# Patient Record
Sex: Female | Born: 1935 | Marital: Single | Smoking: Never smoker
Health system: Southern US, Community
[De-identification: ages and names within clinical notes are randomized; demographics above are authoritative.]

## PROBLEM LIST (undated history)

## (undated) DIAGNOSIS — I10 Essential (primary) hypertension: Secondary | ICD-10-CM

## (undated) HISTORY — DX: Essential (primary) hypertension: I10

---

## 1998-06-23 DIAGNOSIS — I10 Essential (primary) hypertension: Secondary | ICD-10-CM

## 1998-06-23 HISTORY — DX: Essential (primary) hypertension: I10

## 2020-03-02 ENCOUNTER — Emergency Department (HOSPITAL_BASED_OUTPATIENT_CLINIC_OR_DEPARTMENT_OTHER): Payer: Self-pay

## 2020-03-02 ENCOUNTER — Encounter (HOSPITAL_BASED_OUTPATIENT_CLINIC_OR_DEPARTMENT_OTHER): Payer: Self-pay | Admitting: Emergency Medicine

## 2020-03-02 ENCOUNTER — Other Ambulatory Visit: Payer: Self-pay

## 2020-03-02 DIAGNOSIS — I10 Essential (primary) hypertension: Secondary | ICD-10-CM | POA: Insufficient documentation

## 2020-03-02 DIAGNOSIS — C4921 Malignant neoplasm of connective and soft tissue of right lower limb, including hip: Secondary | ICD-10-CM | POA: Insufficient documentation

## 2020-03-02 NOTE — ED Triage Notes (Signed)
Ptc/o right lower leg pain. No injury. Swelling noted to lateral aspect below knee

## 2020-03-03 ENCOUNTER — Emergency Department (HOSPITAL_BASED_OUTPATIENT_CLINIC_OR_DEPARTMENT_OTHER)
Admission: EM | Admit: 2020-03-03 | Discharge: 2020-03-03 | Disposition: A | Discharge status: Home / Self Care | Payer: Self-pay | Attending: Emergency Medicine | Admitting: Emergency Medicine

## 2020-03-03 ENCOUNTER — Emergency Department (HOSPITAL_BASED_OUTPATIENT_CLINIC_OR_DEPARTMENT_OTHER): Payer: Self-pay

## 2020-03-03 ENCOUNTER — Encounter (HOSPITAL_BASED_OUTPATIENT_CLINIC_OR_DEPARTMENT_OTHER): Payer: Self-pay | Admitting: Emergency Medicine

## 2020-03-03 DIAGNOSIS — C4921 Malignant neoplasm of connective and soft tissue of right lower limb, including hip: Secondary | ICD-10-CM

## 2020-03-03 LAB — CBC WITH DIFFERENTIAL/PLATELET
Abs Immature Granulocytes: 0.02 10*3/uL (ref 0.00–0.07)
Basophils Absolute: 0.1 10*3/uL (ref 0.0–0.1)
Basophils Relative: 1 %
Eosinophils Absolute: 0.1 10*3/uL (ref 0.0–0.5)
Eosinophils Relative: 2 %
HCT: 42.8 % (ref 36.0–46.0)
Hemoglobin: 13.8 g/dL (ref 12.0–15.0)
Immature Granulocytes: 0 %
Lymphocytes Relative: 40 %
Lymphs Abs: 2.8 10*3/uL (ref 0.7–4.0)
MCH: 28.5 pg (ref 26.0–34.0)
MCHC: 32.2 g/dL (ref 30.0–36.0)
MCV: 88.2 fL (ref 80.0–100.0)
Monocytes Absolute: 0.7 10*3/uL (ref 0.1–1.0)
Monocytes Relative: 10 %
Neutro Abs: 3.3 10*3/uL (ref 1.7–7.7)
Neutrophils Relative %: 47 %
Platelets: 350 10*3/uL (ref 150–400)
RBC: 4.85 MIL/uL (ref 3.87–5.11)
RDW: 14.3 % (ref 11.5–15.5)
WBC: 7 10*3/uL (ref 4.0–10.5)
nRBC: 0 % (ref 0.0–0.2)

## 2020-03-03 LAB — BASIC METABOLIC PANEL
Anion gap: 10 (ref 5–15)
BUN: 14 mg/dL (ref 8–23)
CO2: 26 mmol/L (ref 22–32)
Calcium: 9.4 mg/dL (ref 8.9–10.3)
Chloride: 101 mmol/L (ref 98–111)
Creatinine, Ser: 0.68 mg/dL (ref 0.44–1.00)
GFR calc Af Amer: >60 mL/min (ref >60)
GFR calc non Af Amer: >60 mL/min (ref >60)
Glucose, Bld: 92 mg/dL (ref 70–99)
Potassium: 4.2 mmol/L (ref 3.5–5.1)
Sodium: 137 mmol/L (ref 135–145)

## 2020-03-03 MED ORDER — IOHEXOL 300 MG/ML  SOLN
100.0000 mL | Freq: Once | INTRAMUSCULAR | Status: AC | PRN
  Administered 2020-03-03: 100 mL via INTRAVENOUS

## 2020-03-03 MED ORDER — AMLODIPINE BESYLATE 5 MG PO TABS
5.0000 mg | ORAL_TABLET | Freq: Every day | ORAL | 1 refills | Status: DC
Start: 2020-03-03 — End: 2020-05-01

## 2020-03-03 NOTE — ED Notes (Signed)
ED Provider at bedside. 

## 2020-03-03 NOTE — ED Notes (Signed)
Pt presents with mass to right leg distal to knee. Is tender to touch and movent. Per son doctor in [place] attemped diagnosis but was unclear on outcome and stated they did not complete diagnosis.  Pt also request refill on BP meds.

## 2020-03-03 NOTE — ED Notes (Signed)
Attempted IV to right hand, blood obtained for labs, unsuccessful IV, tol well

## 2020-03-03 NOTE — ED Provider Notes (Signed)
[facility] Provider Note: Carolyn Spurling, MD, FACEP  CSN: [id number] MRN: [id number] ARRIVAL: 03/02/20 at 2036 ROOM: [place]  Leg Pain   HISTORY OF PRESENT ILLNESS  03/03/20 3:09 AM Carolyn Evans is a 84 y.o. female who is from [place].  She has had a mass of her right lower leg for the past 7 years.  It is located on the anterolateral aspect of the leg.  It began hurting more than usual yesterday to the point that she began crying.  On arrival she rated the pain as an 8 out of 10.  It is worse with movement or palpation.   Past Medical History:  Diagnosis Date  . Hypertension     History reviewed. No pertinent surgical history.  No family history on file.  Social History   Tobacco Use  . Smoking status: Never Smoker  . Smokeless tobacco: Never Used  Substance Use Topics  . Alcohol use: Never  . Drug use: Not on file    Prior to Admission medications   Not on File    Allergies Aspirin   REVIEW OF SYSTEMS  Negative except as noted here or in the History of Present Illness.   PHYSICAL EXAMINATION  Initial Vital Signs Blood pressure (!) 179/84, pulse 80, temperature 98.4 F (36.9 C), temperature source Oral, resp. rate 20, SpO2 100 %.  Examination General: Well-developed, cachectic female in no acute distress; appearance consistent with age of record HENT: normocephalic; atraumatic Eyes: pupils equal, round and reactive to light; extraocular muscles intact; arcus senilis bilaterally Neck: supple Heart: regular rate and rhythm Lungs: clear to auscultation bilaterally Abdomen: soft; nondistended; nontender; bowel sounds present Extremities: Pulses normal; arthritic changes; tender soft tissue mass of right lower leg without erythema or warmth:    Neurologic: Awake, alert; motor function intact in all extremities and symmetric; no facial droop Skin: Warm and dry Psychiatric: Normal mood and affect   RESULTS  Summary of this  visit's results, reviewed and interpreted by myself:   EKG Interpretation  Date/Time:    Ventricular Rate:    PR Interval:    QRS Duration:   QT Interval:    QTC Calculation:   R Axis:     Text Interpretation:        Laboratory Studies: Results for orders placed or performed during the hospital encounter of 03/03/20 (from the past 24 hour(s))  CBC with Differential/Platelet     Status: None   Collection Time: 03/03/20  3:21 AM  Result Value Ref Range   WBC 7.0 4.0 - 10.5 K/uL   RBC 4.85 3.87 - 5.11 MIL/uL   Hemoglobin 13.8 12.0 - 15.0 g/dL   HCT 42.8 36 - 46 %   MCV 88.2 80.0 - 100.0 fL   MCH 28.5 26.0 - 34.0 pg   MCHC 32.2 30.0 - 36.0 g/dL   RDW 14.3 11.5 - 15.5 %   Platelets 350 150 - 400 K/uL   nRBC 0.0 0.0 - 0.2 %   Neutrophils Relative % 47 %   Neutro Abs 3.3 1.7 - 7.7 K/uL   Lymphocytes Relative 40 %   Lymphs Abs 2.8 0.7 - 4.0 K/uL   Monocytes Relative 10 %   Monocytes Absolute 0.7 0 - 1 K/uL   Eosinophils Relative 2 %   Eosinophils Absolute 0.1 0 - 0 K/uL   Basophils Relative 1 %   Basophils Absolute 0.1 0 - 0 K/uL   Immature Granulocytes 0 %  Abs Immature Granulocytes 0.02 0.00 - 0.07 K/uL  Basic metabolic panel     Status: None   Collection Time: 03/03/20  3:21 AM  Result Value Ref Range   Sodium 137 135 - 145 mmol/L   Potassium 4.2 3.5 - 5.1 mmol/L   Chloride 101 98 - 111 mmol/L   CO2 26 22 - 32 mmol/L   Glucose, Bld 92 70 - 99 mg/dL   BUN 14 8 - 23 mg/dL   Creatinine, Ser 0.68 0.44 - 1.00 mg/dL   Calcium 9.4 8.9 - 10.3 mg/dL   GFR calc non Af Amer >60 >60 mL/min   GFR calc Af Amer >60 >60 mL/min   Anion gap 10 5 - 15   Imaging Studies: DG Tibia/Fibula Right  Result Date: 03/02/2020 CLINICAL DATA:  Atraumatic right leg pain. EXAM: RIGHT TIBIA AND FIBULA - 2 VIEW COMPARISON:  None. FINDINGS: There is no evidence of fracture or other focal bone lesions. A 10.8 cm x 1.4 cm area focal soft tissue swelling seen adjacent to the anterior aspect of the  proximal right tibia. IMPRESSION: Focal area of soft tissue swelling adjacent to the anterior aspect of the proximal right tibia. Correlation with MRI is recommended, as an underlying soft tissue mass cannot be excluded. Electronically Signed   By: Virgina Norfolk M.D.   On: 03/02/2020 22:40   CT EXTREMITY LOWER RIGHT W CONTRAST  Result Date: 03/03/2020 CLINICAL DATA:  Lower extremity mass EXAM: CT OF THE LOWER RIGHT EXTREMITY WITH CONTRAST TECHNIQUE: Multidetector CT imaging of the lower right extremity was performed according to the standard protocol following intravenous contrast administration. COMPARISON:  None. CONTRAST:  139mL OMNIPAQUE IOHEXOL 300 MG/ML  SOLN FINDINGS: Bones/Joint/Cartilage No aggressive osseous lesion. Ligaments Suboptimally assessed by CT. Muscles and Tendons Unremarkable Soft tissues There is a bilobed fat density mass within the proximal lower extremity adjacent to the tibial metadiaphysis. The lateral component measures 4.8 x 5.6 by 12.8 cm. The more medial component measures 2.6 by 2.9 by 7.4 cm. The bilobed mass extends between the proximal tibia and fibula. Again the mass is predominantly fat density; however, there are vessels within the mass and subtle wispy densities indicating internal complexity. IMPRESSION: Bilobed fat density mass within the proximal lower extremity adjacent to the tibial metadiaphysis. While the mass is predominantly fat density, there is internal complexity with differential including complex lipoma versus well differentiated liposarcoma. Recommend non emergent orthopedic oncology surgery consultation and further contrast MRI evaluation. No acute findings evident. Electronically Signed   By: Suzy Bouchard M.D.   On: 03/03/2020 04:49    ED COURSE and MDM  Nursing notes, initial and subsequent vitals signs, including pulse oximetry, reviewed and interpreted by myself.  Vitals:   03/02/20 2048 03/03/20 0148 03/03/20 0150 03/03/20 0330  BP: (!)  164/83  (!) 179/84   Pulse: 91  80 68  Resp:   20 20  Temp: 98 F (36.7 C)  98.4 F (36.9 C)   TempSrc: Oral  Oral   SpO2: 100% 98% 100% 100%   Medications  iohexol (OMNIPAQUE) 300 MG/ML solution 100 mL (100 mLs Intravenous Contrast Given 03/03/20 0422)   5:18 AM Advised patient and her son of the CT findings consistent with a lipoma but cannot exclude a liposarcoma.  We will thus refer to orthopedics for further evaluation.   PROCEDURES  Procedures   ED DIAGNOSES     ICD-10-CM   1. 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

## 2020-03-03 NOTE — ED Notes (Signed)
Patient transported to CT 

## 2020-04-12 ENCOUNTER — Encounter: Payer: Self-pay | Admitting: Family Medicine

## 2020-04-12 ENCOUNTER — Other Ambulatory Visit: Payer: Self-pay

## 2020-04-12 ENCOUNTER — Ambulatory Visit (INDEPENDENT_AMBULATORY_CARE_PROVIDER_SITE_OTHER): Payer: Self-pay | Admitting: Family Medicine

## 2020-04-12 VITALS — BP 150/80 | HR 74 | Wt 101.2 lb

## 2020-04-12 DIAGNOSIS — R2241 Localized swelling, mass and lump, right lower limb: Secondary | ICD-10-CM

## 2020-04-12 DIAGNOSIS — Z5989 Other problems related to housing and economic circumstances: Secondary | ICD-10-CM

## 2020-04-12 DIAGNOSIS — I10 Essential (primary) hypertension: Secondary | ICD-10-CM

## 2020-04-12 MED ORDER — MIRTAZAPINE 7.5 MG PO TABS: 7.5000 mg | ORAL_TABLET | Freq: Every day | ORAL | 1 refills | Status: AC

## 2020-04-12 NOTE — Patient Instructions (Signed)
It was a pleasure to see you today!  Thank you for choosing [facility] for your primary care.  Tyleah Haan was seen for leg swelling .   Our plans for today were:  Please let us know when you have been able to complete the [employer]  paper work.   I will work with our referral specialist to coordinate an affordable option for a surgeon to evaluate the leg mass.    You should return to our clinic in 3-4 weeks for follow up.   Best Wishes,   Dr. Alba Cory

## 2020-04-12 NOTE — Progress Notes (Signed)
    SUBJECTIVE:   CHIEF COMPLAINT / HPI: follow up for leg mass   [place] Interpreter Used during this encounter  Patient seen in the ED on 9/11 for painful leg mass. Imaging at that time was concerning for liposarcoma or lipoma. Mass has been present for 10 years and was evaluated at in [place] but did not have equipment to perform necessary biopsy/surgery. Patient reports that the mass sometimes interferes with her ability to ambulate. It is sometimes painful but usually not as bad as the time she was seen in the ED. She was referred to orthopedics but due to no insurance coverage, they did not send her for MRI due to concern for out of pocket cost. Patient's nephew is present during interview and states that they are currently working to apply for [employer] .    HTN  Has not been checking blood pressure at home. Has been taking amlodipine for 5-6 years. Denies any leg swelling associated with medications. Denies regular HA or chest pain/SOB.    PERTINENT  PMH / PSH: HTN, RLE mass   OBJECTIVE:   BP (!) 150/80   Pulse 74   Wt 101 lb 3.2 oz (45.9 kg)   SpO2 99%    General: female appearing stated age in no acute distress, frail appearing with thin extremities  Cardio: Normal S1 and S2, no S3 or S4. Rhythm is regular. Bilateral radial pulses palpable Pulm: Clear to auscultation bilaterally, no crackles, wheezing, or diminished breath sounds. Normal respiratory effort Abdomen: Bowel sounds normal. Abdomen soft and non-tender.  Extremities: No peripheral edema. RLE with mass as pictured below   Neuro: pt alert and oriented x4  Media Information   Document Information   Media Information   Document Information     ASSESSMENT/PLAN:   Leg mass, right Leg mass intermittently painful. Recent CT imaging with differential of liposarcoma vs lipoma. Ultimately, patient needs MRI and likely biopsy vs excision but recommended to have orthopedic or orthopedic oncology  referral. Further evaluation limited due to financial concerns - referral to chronic care management to assist with resources/insurance enrollment   HTN, goal below 150/90 BP borderline for elevation given age today. Patient will continue amlodipine.  - refill sent for medication     Eulis Foster, MD [place]

## 2020-04-16 DIAGNOSIS — I10 Essential (primary) hypertension: Secondary | ICD-10-CM | POA: Insufficient documentation

## 2020-04-16 DIAGNOSIS — R2241 Localized swelling, mass and lump, right lower limb: Secondary | ICD-10-CM | POA: Insufficient documentation

## 2020-04-16 NOTE — Assessment & Plan Note (Signed)
Leg mass intermittently painful. Recent CT imaging with differential of liposarcoma vs lipoma. Ultimately, patient needs MRI and likely biopsy vs excision but recommended to have orthopedic or orthopedic oncology referral. Further evaluation limited due to financial concerns - referral to chronic care management to assist with resources/insurance enrollment

## 2020-04-16 NOTE — Assessment & Plan Note (Signed)
BP borderline for elevation given age today. Patient will continue amlodipine.  - refill sent for medication

## 2020-04-20 ENCOUNTER — Telehealth: Payer: Self-pay | Admitting: Family Medicine

## 2020-04-20 NOTE — Telephone Encounter (Signed)
   SF 04/20/2020   Name: Carolyn Evans   MRN: [id number]   DOB: [birth year]   AGE: 84 y.o.   GENDER: female   PCP Simmons-Robinson, Riki Sheer, MD.    [facility]. Connected with [place] (ID [id number]). Contacted patient's son Marcy Siren he stated he speaks a  [place]. [place] Interpreter stayed on call to provide assistance as needed. Spoke with Marcy Siren  regarding the referral for his mother [name]. Patient needed assistance with getting insurance. Marcy Siren stated that his mother does not qualify for Medicaid or Medicare because she does not have any income. He stated that he has applied for the [employer] and Ms. Angalena Schildt was approved. Marcy Siren  has applied for [place] assistance for his mom  and is waiting to hear back from them. Asked Marcy Siren if his mom has any additional needs at this time. Abul stated she does not. Informed Marcy Siren that if he does not hear back from [facility] that he can give them a call to check the status of his mom's application. Marcy Siren stated understanding.   Closing referral pending any other needs of patient.     [place], Care Management Phone: [phone] Email: [email or web address]

## 2020-05-01 ENCOUNTER — Ambulatory Visit (HOSPITAL_COMMUNITY)
Admission: RE | Admit: 2020-05-01 | Discharge: 2020-05-01 | Disposition: A | Discharge status: Home / Self Care | Payer: Self-pay | Source: Ambulatory Visit | Attending: Family Medicine | Admitting: Family Medicine

## 2020-05-01 ENCOUNTER — Other Ambulatory Visit: Payer: Self-pay

## 2020-05-01 ENCOUNTER — Ambulatory Visit (INDEPENDENT_AMBULATORY_CARE_PROVIDER_SITE_OTHER): Payer: Self-pay | Admitting: Family Medicine

## 2020-05-01 ENCOUNTER — Encounter: Payer: Self-pay | Admitting: Family Medicine

## 2020-05-01 DIAGNOSIS — R2241 Localized swelling, mass and lump, right lower limb: Secondary | ICD-10-CM

## 2020-05-01 DIAGNOSIS — R002 Palpitations: Secondary | ICD-10-CM

## 2020-05-01 DIAGNOSIS — I10 Essential (primary) hypertension: Secondary | ICD-10-CM

## 2020-05-01 MED ORDER — AMLODIPINE BESYLATE 5 MG PO TABS: 10.0000 mg | ORAL_TABLET | Freq: Every day | ORAL | 2 refills | Status: DC

## 2020-05-01 MED ORDER — AMLODIPINE BESYLATE 5 MG PO TABS
10.0000 mg | ORAL_TABLET | Freq: Every day | ORAL | 2 refills | Status: DC
Start: 2020-05-01 — End: 2020-05-01

## 2020-05-01 NOTE — Patient Instructions (Addendum)
I have sent a referral to orthopedics for further evaluation of your leg mass.  Please let us know if you do not hear from them in order to make an appointment within the next 2 weeks.  Please follow-up with me in 6-8 weeks for your leg mass and AFTER you have been able to see orthopedics.  Please use 400 mg of ibuprofen every 6-8 hours for leg pain.  I also recommend applying a heating pad to your leg to help with any pain.  For your heart rate, I believe you are likely having premature ventricular contractions. These are common and with them occurring infrequently, we will monitor you for now unless they become more bothersome for you. We can recheck an EKG in 6 months.

## 2020-05-01 NOTE — Progress Notes (Signed)
° ° °  SUBJECTIVE:   CHIEF COMPLAINT / HPI: f/u leg mass   Patient's daughter in law is present for this encounter and provides majority of history for this encounter.  Patient reports that her leg pain has been coming and going but painful episodes make it more difficult to ambulate. This pain sometimes radiates to her thigh.  Family has been giving ibuprofen that helps, usually give 400mg   Patient has not been doing heat or ice application to leg mass when experiencing pain. Patient and family deny any decreased sensation to her RLE. Family member rerports that she has some pain that wakes her out of sleep   Patient was recently approved for financial asisstance from 9/21 until 09/10/20. Called to confirm assistance will cover orthopedics referral and MRI if deemed appropriate and was advised that patient will be covered as long as the provider is in the [facility] system.   HTN  Patient has been tolerating amlodipine well without recent HA or chest pain.   Palpitations  Patient's family reports that the patient has been reporting chest discomfort and increased HR with ~3 episodes per week. Patient denies feeling SOB during these episodes.   PERTINENT  PMH / PSH:  Language Barrier  HTN  RLE mass   OBJECTIVE:   BP (!) 162/82    Pulse 97    Wt 104 lb (47.2 kg)    SpO2 97%    General: female appearing stated age in no acute distress Cardio: tachycardia. No murmurs or rubs.  Bilateral radial pulses palpable Pulm: Clear to auscultation bilaterally, no crackles, wheezing, or diminished breath sounds. Normal respiratory effort Extremities: No peripheral edema. Warm/ well perfused. RLE leg mass tender to palpation, no increase is diameter of mass, not warm to touch, popliteal pulse intact on RLE, posterior tibial also palpable on RLE   ASSESSMENT/PLAN:   Leg mass, right Still experiencing intermittent pain that is improved with iburprofen  Normal Cr 0.68 Advised family to use 400mg   every 6 hours for pain  Will refer to orthropedics for further evaluation and clarify services covered with financial asst prior to referral   Intermittent palpitations Palpitations intermittently.  Could be due to patient's low BMI, no murmur appreciated on exam. EKG difficult to interpret but patient appears to be in NSR with possible PVCS  - repeat EKG in 6 months  - patient given return precautions including increasing frequency of palpitations at rest, chest pains that are persistent and SOB   HTN, goal below 150/90 BP elevated for last three hospital/clinic encounters.  Increase amlodipine to 10mg  daily      Eulis Foster, MD [place]

## 2020-05-01 NOTE — Assessment & Plan Note (Addendum)
Palpitations intermittently.  Could be due to patient's low BMI, no murmur appreciated on exam. EKG difficult to interpret but patient appears to be in NSR with possible PVCS  - repeat EKG in 6 months  - patient given return precautions including increasing frequency of palpitations at rest, chest pains that are persistent and SOB

## 2020-05-01 NOTE — Assessment & Plan Note (Signed)
Still experiencing intermittent pain that is improved with iburprofen  Normal Cr 0.68 Advised family to use 400mg  every 6 hours for pain  Will refer to orthropedics for further evaluation and clarify services covered with financial asst prior to referral

## 2020-05-01 NOTE — Assessment & Plan Note (Signed)
BP elevated for last three hospital/clinic encounters.  Increase amlodipine to 10mg  daily

## 2020-05-02 ENCOUNTER — Ambulatory Visit: Payer: Self-pay | Admitting: Nurse Practitioner

## 2020-05-07 ENCOUNTER — Ambulatory Visit (INDEPENDENT_AMBULATORY_CARE_PROVIDER_SITE_OTHER): Payer: Self-pay | Admitting: Orthopedic Surgery

## 2020-05-07 ENCOUNTER — Encounter: Payer: Self-pay | Admitting: Orthopedic Surgery

## 2020-05-07 VITALS — Wt 104.0 lb

## 2020-05-07 DIAGNOSIS — R2241 Localized swelling, mass and lump, right lower limb: Secondary | ICD-10-CM

## 2020-05-07 NOTE — Progress Notes (Signed)
Office Visit Note   Patient: Carolyn Evans           Date of Birth: [birth year]           MRN: [id number] Visit Date: 05/07/2020              Requested by: McDiarmid, Blane Ohara, MD [address] [place],  [place] [address] PCP: Eulis Foster, MD  Chief Complaint  Patient presents with  . Right Leg - Mass      HPI: Patient is an 85 year old woman who presents with a lipoma-like mass proximal lateral right leg just inferior to the knee.  Patient states the mass has been there for over 10 years has not changed in size she states she occasionally has pain at night.  She has not taking any pain medicine for it.  Assessment & Plan: Visit Diagnoses:  1. Mass of right lower leg     Plan: CT scan is not completely diagnostic for a benign lipoma.  Will order a MRI scan with contrast to evaluate for liposarcoma.  Follow-Up Instructions: Return in about 2 weeks (around 05/21/2020).   Ortho Exam  Patient is alert, oriented, no adenopathy, well-dressed, normal affect, normal respiratory effort. Examination patient has a soft mobile mass over the proximal lateral aspect of the right leg just inferior to the knee.  The mass is not tender to palpation there is no skin color or temperature changes.  Review of the CT scan shows a bilobed adipose tissue mass that extends from the anterior lateral proximal right leg through the intermuscular septum and extends up to the popliteal fossa.  Radiology feels that there are some complexities within the lipoma that is concerning for possible liposarcoma.  Imaging: No results found. No images are attached to the encounter.  Labs: No results found for: HGBA1C, ESRSEDRATE, CRP, LABURIC, REPTSTATUS, GRAMSTAIN, CULT, LABORGA   No results found for: ALBUMIN, PREALBUMIN, LABURIC  No results found for: MG No results found for: VD25OH  No results found for: PREALBUMIN CBC EXTENDED Latest Ref Rng & Units 03/03/2020  WBC 4.0 - 10.5 K/uL 7.0  RBC  3.87 - 5.11 MIL/uL 4.85  HGB 12.0 - 15.0 g/dL 13.8  HCT 36 - 46 % 42.8  PLT 150 - 400 K/uL 350  NEUTROABS 1.7 - 7.7 K/uL 3.3  LYMPHSABS 0.7 - 4.0 K/uL 2.8     There is no height or weight on file to calculate BMI.  Orders:  No orders of the defined types were placed in this encounter.  No orders of the defined types were placed in this encounter.    Procedures: No procedures performed  Clinical Data: No additional findings.  ROS:  All other systems negative, except as noted in the HPI. Review of Systems  Objective: Vital Signs: Wt 104 lb (47.2 kg)   Specialty Comments:  No specialty comments available.  PMFS History: Patient Active Problem List   Diagnosis Date Noted  . Intermittent palpitations 05/01/2020  . Leg mass, right 04/16/2020  . HTN, goal below 150/90 04/16/2020   Past Medical History:  Diagnosis Date  . Hypertension 2000    Family History  Problem Relation Age of Onset  . Heart disease Mother   . Heart disease Sister   . High blood pressure Sister     History reviewed. No pertinent surgical history. Social History   Occupational History  . Not on file  Tobacco Use  . Smoking status: Never Smoker  . Smokeless tobacco: Never  Used  Substance and Sexual Activity  . Alcohol use: Never  . Drug use: Never  . Sexual activity: Not on file

## 2020-05-15 ENCOUNTER — Telehealth: Payer: Self-pay | Admitting: Orthopedic Surgery

## 2020-05-15 NOTE — Telephone Encounter (Signed)
Called patient 1X left vm for patient to call and set appt with Dr. Marlou Sa for MRI after 05/27/20. Will try back later

## 2020-05-27 ENCOUNTER — Ambulatory Visit
Admission: RE | Admit: 2020-05-27 | Discharge: 2020-05-27 | Disposition: A | Discharge status: Home / Self Care | Payer: Self-pay | Source: Ambulatory Visit | Attending: Orthopedic Surgery | Admitting: Orthopedic Surgery

## 2020-05-27 DIAGNOSIS — R2241 Localized swelling, mass and lump, right lower limb: Secondary | ICD-10-CM

## 2020-05-27 MED ORDER — GADOBENATE DIMEGLUMINE 529 MG/ML IV SOLN
9.0000 mL | Freq: Once | INTRAVENOUS | Status: AC | PRN
  Administered 2020-05-27: 9 mL via INTRAVENOUS

## 2020-05-29 ENCOUNTER — Ambulatory Visit (INDEPENDENT_AMBULATORY_CARE_PROVIDER_SITE_OTHER): Payer: Self-pay | Admitting: Orthopedic Surgery

## 2020-05-29 ENCOUNTER — Other Ambulatory Visit: Payer: Self-pay

## 2020-05-29 DIAGNOSIS — R2241 Localized swelling, mass and lump, right lower limb: Secondary | ICD-10-CM

## 2020-05-30 ENCOUNTER — Encounter: Payer: Self-pay | Admitting: Orthopedic Surgery

## 2020-05-30 NOTE — Progress Notes (Signed)
   Office Visit Note   Patient: Carolyn Evans           Date of Birth: 12/28/1935           MRN: [id number] Visit Date: 05/29/2020              Requested by: Eulis Foster, MD [address],  [place] [address] PCP: Eulis Foster, MD  Chief Complaint  Patient presents with  . Right Knee - Follow-up    MRI Rt knee mass      HPI: Patient is seen in follow-up for a chronic right knee mass consistent with an atypical adipose tissue mass.  Patient has just undergone an MRI scan.  Assessment & Plan: Visit Diagnoses:  1. Mass of right lower leg     Plan: We will have the patient follow-up with orthopedic oncology at [facility] the fat density is atypical in nature and will need a orthopedic oncologist surgical evaluation for treatment.  Follow-Up Instructions: Return if symptoms worsen or fail to improve.   Ortho Exam  Patient is alert, oriented, no adenopathy, well-dressed, normal affect, normal respiratory effort. Examination patient denies any acute changes she states it is a large painful mass.  Review of the MRI scan shows a large multilobulated adipose tissue mass that is 14 cm in diameter and extends through the compartment.  There is some complexity to the mass that is not consistent with a simple lipoma.  Of note patient also has osteoarthritis of her right knee.  Imaging: No results found. No images are attached to the encounter.  Labs: No results found for: HGBA1C, ESRSEDRATE, CRP, LABURIC, REPTSTATUS, GRAMSTAIN, CULT, LABORGA   No results found for: ALBUMIN, PREALBUMIN, LABURIC  No results found for: MG No results found for: VD25OH  No results found for: PREALBUMIN CBC EXTENDED Latest Ref Rng & Units 03/03/2020  WBC 4.0 - 10.5 K/uL 7.0  RBC 3.87 - 5.11 MIL/uL 4.85  HGB 12.0 - 15.0 g/dL 13.8  HCT 36 - 46 % 42.8  PLT 150 - 400 K/uL 350  NEUTROABS 1.7 - 7.7 K/uL 3.3  LYMPHSABS 0.7 - 4.0 K/uL 2.8     There is no height or  weight on file to calculate BMI.  Orders:  No orders of the defined types were placed in this encounter.  No orders of the defined types were placed in this encounter.    Procedures: No procedures performed  Clinical Data: No additional findings.  ROS:  All other systems negative, except as noted in the HPI. Review of Systems  Objective: Vital Signs: There were no vitals taken for this visit.  Specialty Comments:  No specialty comments available.  PMFS History: Patient Active Problem List   Diagnosis Date Noted  . Intermittent palpitations 05/01/2020  . Leg mass, right 04/16/2020  . HTN, goal below 150/90 04/16/2020   Past Medical History:  Diagnosis Date  . Hypertension 2000    Family History  Problem Relation Age of Onset  . Heart disease Mother   . Heart disease Sister   . High blood pressure Sister     No past surgical history on file. Social History   Occupational History  . Not on file  Tobacco Use  . Smoking status: Never Smoker  . Smokeless tobacco: Never Used  Substance and Sexual Activity  . Alcohol use: Never  . Drug use: Never  . Sexual activity: Not on file

## 2020-06-05 ENCOUNTER — Telehealth: Payer: Self-pay | Admitting: Orthopedic Surgery

## 2020-06-05 NOTE — Telephone Encounter (Signed)
Pts son called stating he has yet to receive a call from [facility] and would like to know what the next step is in the waiting process

## 2020-06-05 NOTE — Telephone Encounter (Signed)
Can you please call pt with update on referral to [facility] for leg mass?

## 2020-06-06 NOTE — Telephone Encounter (Signed)
[place] at [facility] orthopedics oncology and he stated pt referral is in review and should be hearing to set up appt sometime this week, I called pt son back and informed of message.

## 2020-07-25 ENCOUNTER — Other Ambulatory Visit: Payer: Self-pay | Admitting: Family Medicine

## 2020-07-25 DIAGNOSIS — I10 Essential (primary) hypertension: Secondary | ICD-10-CM

## 2021-10-09 ENCOUNTER — Other Ambulatory Visit: Payer: Self-pay | Admitting: Family Medicine

## 2021-10-09 DIAGNOSIS — I10 Essential (primary) hypertension: Secondary | ICD-10-CM

## 2021-12-01 ENCOUNTER — Other Ambulatory Visit: Payer: Self-pay | Admitting: Family Medicine

## 2021-12-01 DIAGNOSIS — I10 Essential (primary) hypertension: Secondary | ICD-10-CM

## 2021-12-09 ENCOUNTER — Emergency Department (HOSPITAL_COMMUNITY)
Admission: EM | Admit: 2021-12-09 | Discharge: 2021-12-09 | Disposition: A | Discharge status: Home / Self Care | Payer: Self-pay | Attending: Emergency Medicine | Admitting: Emergency Medicine

## 2021-12-09 ENCOUNTER — Emergency Department (HOSPITAL_COMMUNITY): Payer: Self-pay

## 2021-12-09 DIAGNOSIS — R0789 Other chest pain: Secondary | ICD-10-CM | POA: Insufficient documentation

## 2021-12-09 DIAGNOSIS — S39012A Strain of muscle, fascia and tendon of lower back, initial encounter: Secondary | ICD-10-CM | POA: Insufficient documentation

## 2021-12-09 DIAGNOSIS — Z79899 Other long term (current) drug therapy: Secondary | ICD-10-CM | POA: Insufficient documentation

## 2021-12-09 DIAGNOSIS — M25511 Pain in right shoulder: Secondary | ICD-10-CM | POA: Insufficient documentation

## 2021-12-09 DIAGNOSIS — X58XXXA Exposure to other specified factors, initial encounter: Secondary | ICD-10-CM | POA: Insufficient documentation

## 2021-12-09 LAB — CBC WITH DIFFERENTIAL/PLATELET
Abs Immature Granulocytes: 0.04 10*3/uL (ref 0.00–0.07)
Basophils Absolute: 0.1 10*3/uL (ref 0.0–0.1)
Basophils Relative: 1 %
Eosinophils Absolute: 0.1 10*3/uL (ref 0.0–0.5)
Eosinophils Relative: 1 %
HCT: 45 % (ref 36.0–46.0)
Hemoglobin: 14.8 g/dL (ref 12.0–15.0)
Immature Granulocytes: 0 %
Lymphocytes Relative: 20 %
Lymphs Abs: 2.3 10*3/uL (ref 0.7–4.0)
MCH: 28.7 pg (ref 26.0–34.0)
MCHC: 32.9 g/dL (ref 30.0–36.0)
MCV: 87.4 fL (ref 80.0–100.0)
Monocytes Absolute: 0.9 10*3/uL (ref 0.1–1.0)
Monocytes Relative: 8 %
Neutro Abs: 8.1 10*3/uL — ABNORMAL HIGH (ref 1.7–7.7)
Neutrophils Relative %: 70 %
Platelets: 400 10*3/uL (ref 150–400)
RBC: 5.15 MIL/uL — ABNORMAL HIGH (ref 3.87–5.11)
RDW: 13.8 % (ref 11.5–15.5)
WBC: 11.5 10*3/uL — ABNORMAL HIGH (ref 4.0–10.5)
nRBC: 0 % (ref 0.0–0.2)

## 2021-12-09 LAB — COMPREHENSIVE METABOLIC PANEL
ALT: 16 U/L (ref 0–44)
AST: 25 U/L (ref 15–41)
Albumin: 4 g/dL (ref 3.5–5.0)
Alkaline Phosphatase: 73 U/L (ref 38–126)
Anion gap: 9 (ref 5–15)
BUN: 10 mg/dL (ref 8–23)
CO2: 26 mmol/L (ref 22–32)
Calcium: 9.8 mg/dL (ref 8.9–10.3)
Chloride: 102 mmol/L (ref 98–111)
Creatinine, Ser: 0.71 mg/dL (ref 0.44–1.00)
GFR, Estimated: >60 mL/min (ref >60)
Glucose, Bld: 98 mg/dL (ref 70–99)
Potassium: 3.9 mmol/L (ref 3.5–5.1)
Sodium: 137 mmol/L (ref 135–145)
Total Bilirubin: 0.8 mg/dL (ref 0.3–1.2)
Total Protein: 9.2 g/dL — ABNORMAL HIGH (ref 6.5–8.1)

## 2021-12-09 LAB — LIPASE, BLOOD: Lipase: 32 U/L (ref 11–51)

## 2021-12-09 LAB — D-DIMER, QUANTITATIVE: D-Dimer, Quant: 0.79 ug/mL-FEU — ABNORMAL HIGH (ref 0.00–0.50)

## 2021-12-09 LAB — TROPONIN I (HIGH SENSITIVITY): Troponin I (High Sensitivity): 3 ng/L (ref <18)

## 2021-12-09 MED ORDER — DICLOFENAC SODIUM 1 % EX GEL: 4.0000 g | Freq: Four times a day (QID) | CUTANEOUS | 0 refills | Status: AC

## 2021-12-09 MED ORDER — DICLOFENAC SODIUM 1 % EX GEL: 4.0000 g | Freq: Four times a day (QID) | CUTANEOUS | 0 refills | Status: DC

## 2021-12-09 MED ORDER — ONDANSETRON HCL 4 MG/2ML IJ SOLN
4.0000 mg | Freq: Once | INTRAMUSCULAR | Status: AC
  Administered 2021-12-09: 4 mg via INTRAVENOUS
  Filled 2021-12-09: qty 2

## 2021-12-09 MED ORDER — MORPHINE SULFATE (PF) 2 MG/ML IV SOLN
2.0000 mg | Freq: Once | INTRAVENOUS | Status: AC
  Administered 2021-12-09: 2 mg via INTRAVENOUS
  Filled 2021-12-09: qty 1

## 2021-12-09 NOTE — ED Triage Notes (Signed)
Pt arrives via [facility] from home for R shoulder pain. Per [facility] pain has been ongoing for over one week. Denies known trauma, CP, SOB.    EMS last VS 184/68, HR 80, 96% on RA, RR 20, CBG 101.

## 2021-12-09 NOTE — ED Provider Notes (Signed)
[facility] DEPT Provider Note   CSN: [id number] Arrival date & time: 12/09/21  7341     History  Chief Complaint  Patient presents with   Back Pain    Carolyn Evans is a 86 y.o. female.  86 yo F with a cc of right-sided chest and shoulder pain.  This has been going on for about a week after a long car trip.  Patient denies injury.  Has some pain with deep inspiration.  Denies rash denies trauma.  Denies cough or congestion.   Back Pain      Home Medications Prior to Admission medications   Medication Sig Start Date End Date Taking? Authorizing Provider  amLODipine (NORVASC) 5 MG tablet TAKE 2 TABLETS(10 MG) BY MOUTH DAILY 12/02/21   Simmons-Robinson, Riki Sheer, MD  diclofenac Sodium (VOLTAREN) 1 % GEL Apply 4 g topically 4 (four) times daily. 12/09/21   Deno Etienne, DO  mirtazapine (REMERON) 7.5 MG tablet Take 1 tablet (7.5 mg total) by mouth at bedtime. 04/12/20   Simmons-Robinson, Riki Sheer, MD      Allergies    Aspirin    Review of Systems   Review of Systems  Musculoskeletal:  Positive for back pain.    Physical Exam Updated Vital Signs BP (!) 169/74   Pulse 80   Temp 97.7 F (36.5 C) (Oral)   Resp (!) 21   SpO2 100%  Physical Exam Vitals and nursing note reviewed.  Constitutional:      General: She is not in acute distress.    Appearance: She is well-developed. She is not diaphoretic.  HENT:     Head: Normocephalic and atraumatic.  Eyes:     Pupils: Pupils are equal, round, and reactive to light.  Cardiovascular:     Rate and Rhythm: Normal rate and regular rhythm.     Heart sounds: No murmur heard.    No friction rub. No gallop.  Pulmonary:     Effort: Pulmonary effort is normal.     Breath sounds: No wheezing or rales.  Abdominal:     General: There is no distension.     Palpations: Abdomen is soft.     Tenderness: There is no abdominal tenderness.  Musculoskeletal:        General: Tenderness present.     Cervical back:  Normal range of motion and neck supple.     Comments: Pain to the right shoulder with forward flexion up to 90 degrees. Pain about the right chest wall.   PMS intact to the RUE.   Skin:    General: Skin is warm and dry.  Neurological:     Mental Status: She is alert and oriented to person, place, and time.  Psychiatric:        Behavior: Behavior normal.     ED Results / Procedures / Treatments   Labs (all labs ordered are listed, but only abnormal results are displayed) Labs Reviewed  CBC WITH DIFFERENTIAL/PLATELET - Abnormal; Notable for the following components:      Result Value   WBC 11.5 (*)    RBC 5.15 (*)    Neutro Abs 8.1 (*)    All other components within normal limits  COMPREHENSIVE METABOLIC PANEL - Abnormal; Notable for the following components:   Total Protein 9.2 (*)    All other components within normal limits  D-DIMER, QUANTITATIVE - Abnormal; Notable for the following components:   D-Dimer, Quant 0.79 (*)    All other components within normal limits  LIPASE, BLOOD  TROPONIN I (HIGH SENSITIVITY)    EKG EKG Interpretation  Date/Time:  Monday December 09 2021 10:16:27 EDT Ventricular Rate:  83 PR Interval:  158 QRS Duration: 94 QT Interval:  407 QTC Calculation: 479 R Axis:   79 Text Interpretation: Sinus rhythm Biatrial enlargement Left ventricular hypertrophy Baseline wander TECHNICALLY DIFFICULT Confirmed by Deno Etienne [phone]) on 12/09/2021 10:34:27 AM  Radiology DG Chest Port 1 View  Result Date: 12/09/2021 CLINICAL DATA:  Right chest pain EXAM: PORTABLE CHEST 1 VIEW COMPARISON:  None Available. FINDINGS: The heart size and mediastinal contours are within normal limits. Atherosclerotic calcification of the aortic arch. Both lungs are clear. Bilateral glenohumeral osteoarthritis. No acute osseous abnormality. IMPRESSION: No active disease. Electronically Signed   By: Keane Police D.O.   On: 12/09/2021 10:46    Procedures Procedures    Medications  Ordered in ED Medications  ondansetron (ZOFRAN) injection 4 mg (4 mg Intravenous Given 12/09/21 1044)  morphine (PF) 2 MG/ML injection 2 mg (2 mg Intravenous Given 12/09/21 1040)    ED Course/ Medical Decision Making/ A&P                           Medical Decision Making Amount and/or Complexity of Data Reviewed Labs: ordered. Radiology: ordered.  Risk Prescription drug management.   86 yo F with a cc of R sided chest discomfort and right shoulder pain.  This has been going on for about a week after a long car trip.  Pain seems to be worse with movement palpation and twisting.  It is difficult to communicate as the patient speaks [place] as a secondary language that is what we have interpretation with.  We will obtain a chest x-ray blood work.  Consider CT imaging if D-dimer is positive.  Most likely muscular strain after a long car ride.  Chest x-ray independently interpreted by me without focal fracture or pneumothorax.  D-dimer is age-adjusted negative.  Troponin negative.  Work-up otherwise unremarkable.  Patient feeling better on reassessment.  Family has arrived and are able to provide further history.  Tell me that she has actually had this pain for about a year now.  They are not sure exactly the cause.  Seems to be worse with movement palpation and twisting.  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  Will treat as musculoskeletal.  PCP follow-up.  12:17 PM:  I have discussed the diagnosis/risks/treatment options with the patient and family.  Evaluation and diagnostic testing in the emergency department does not suggest an emergent condition requiring admission or immediate intervention beyond what has been performed at this time.  They will follow up with  PCP. We also discussed returning to the ED immediately if new or worsening sx occur. We discussed the sx which are most concerning (e.g., sudden worsening pain, fever, inability to  tolerate by mouth) that necessitate immediate return. Medications administered to the patient during their visit and any new prescriptions provided to the patient are listed below.  Medications given during this visit Medications  ondansetron (ZOFRAN) injection 4 mg (4 mg Intravenous Given 12/09/21 1044)  morphine (PF) 2 MG/ML injection 2 mg (2 mg Intravenous Given 12/09/21 1040)     The patient appears reasonably screen and/or stabilized for discharge and I doubt any other medical condition or other [facility] requiring further screening, evaluation, or treatment in the ED at this  time prior to discharge.          Final Clinical Impression(s) / ED Diagnoses Final diagnoses:  Back strain, initial encounter    Rx / DC Orders ED Discharge Orders          Ordered    diclofenac Sodium (VOLTAREN) 1 % GEL  4 times daily,   Status:  Discontinued        12/09/21 1152    diclofenac Sodium (VOLTAREN) 1 % GEL  4 times daily        12/09/21 [place], Emmory Solivan, DO 12/09/21 1217

## 2021-12-09 NOTE — ED Triage Notes (Signed)
Pt denies shoulder pain during triage, states pain is in her R side of her back.

## 2021-12-09 NOTE — Discharge Instructions (Addendum)
Your back pain is most likely due to a muscular strain.  There is been a lot of research on back pain, unfortunately the only thing that seems to really help is Tylenol and ibuprofen.  Relative rest is also important to not lift greater than 10 pounds bending or twisting at the waist.  Please follow-up with your family physician.  The other thing that really seems to benefit patients is physical therapy which your doctor may send you for.  Please return to the emergency department for new numbness or weakness to your arms or legs. Difficulty with urinating or urinating or pooping on yourself.  Also if you cannot feel toilet paper when you wipe or get a fever.   Use the gel as prescribed Also take tylenol 1000mg(2 extra strength) four times a day.   

## 2022-01-16 ENCOUNTER — Other Ambulatory Visit: Payer: Self-pay | Admitting: Family Medicine

## 2022-01-16 DIAGNOSIS — I10 Essential (primary) hypertension: Secondary | ICD-10-CM

## 2022-05-05 ENCOUNTER — Emergency Department (HOSPITAL_COMMUNITY): Payer: Self-pay

## 2022-05-05 ENCOUNTER — Encounter (HOSPITAL_COMMUNITY): Payer: Self-pay

## 2022-05-05 ENCOUNTER — Observation Stay (HOSPITAL_COMMUNITY)
Admission: EM | Admit: 2022-05-05 | Discharge: 2022-05-06 | Discharge status: Against Medical Advice | Payer: Self-pay | Attending: Internal Medicine | Admitting: Internal Medicine

## 2022-05-05 ENCOUNTER — Ambulatory Visit (HOSPITAL_COMMUNITY)
Admission: EM | Admit: 2022-05-05 | Discharge: 2022-05-05 | Disposition: A | Discharge status: Other Acute Inpatient Hospital | Payer: Self-pay | Attending: Physician Assistant | Admitting: Physician Assistant

## 2022-05-05 DIAGNOSIS — I6310 Cerebral infarction due to embolism of unspecified precerebral artery: Secondary | ICD-10-CM

## 2022-05-05 DIAGNOSIS — E860 Dehydration: Secondary | ICD-10-CM | POA: Insufficient documentation

## 2022-05-05 DIAGNOSIS — W1839XA Other fall on same level, initial encounter: Secondary | ICD-10-CM | POA: Insufficient documentation

## 2022-05-05 DIAGNOSIS — R079 Chest pain, unspecified: Secondary | ICD-10-CM

## 2022-05-05 DIAGNOSIS — E785 Hyperlipidemia, unspecified: Secondary | ICD-10-CM | POA: Insufficient documentation

## 2022-05-05 DIAGNOSIS — Y92009 Unspecified place in unspecified non-institutional (private) residence as the place of occurrence of the external cause: Secondary | ICD-10-CM | POA: Insufficient documentation

## 2022-05-05 DIAGNOSIS — Z79899 Other long term (current) drug therapy: Secondary | ICD-10-CM | POA: Insufficient documentation

## 2022-05-05 DIAGNOSIS — R61 Generalized hyperhidrosis: Secondary | ICD-10-CM | POA: Insufficient documentation

## 2022-05-05 DIAGNOSIS — R0602 Shortness of breath: Secondary | ICD-10-CM

## 2022-05-05 DIAGNOSIS — M25532 Pain in left wrist: Secondary | ICD-10-CM | POA: Insufficient documentation

## 2022-05-05 DIAGNOSIS — D1720 Benign lipomatous neoplasm of skin and subcutaneous tissue of unspecified limb: Secondary | ICD-10-CM | POA: Diagnosis present

## 2022-05-05 DIAGNOSIS — F05 Delirium due to known physiological condition: Secondary | ICD-10-CM

## 2022-05-05 DIAGNOSIS — I639 Cerebral infarction, unspecified: Secondary | ICD-10-CM | POA: Insufficient documentation

## 2022-05-05 DIAGNOSIS — R634 Abnormal weight loss: Secondary | ICD-10-CM | POA: Insufficient documentation

## 2022-05-05 DIAGNOSIS — I10 Essential (primary) hypertension: Secondary | ICD-10-CM | POA: Insufficient documentation

## 2022-05-05 DIAGNOSIS — R55 Syncope and collapse: Principal | ICD-10-CM | POA: Insufficient documentation

## 2022-05-05 DIAGNOSIS — D1723 Benign lipomatous neoplasm of skin and subcutaneous tissue of right leg: Secondary | ICD-10-CM | POA: Insufficient documentation

## 2022-05-05 DIAGNOSIS — R4182 Altered mental status, unspecified: Secondary | ICD-10-CM | POA: Insufficient documentation

## 2022-05-05 LAB — COMPREHENSIVE METABOLIC PANEL
ALT: 15 U/L (ref 0–44)
AST: 28 U/L (ref 15–41)
Albumin: 3.5 g/dL (ref 3.5–5.0)
Alkaline Phosphatase: 58 U/L (ref 38–126)
Anion gap: 8 (ref 5–15)
BUN: 11 mg/dL (ref 8–23)
CO2: 25 mmol/L (ref 22–32)
Calcium: 9.2 mg/dL (ref 8.9–10.3)
Chloride: 103 mmol/L (ref 98–111)
Creatinine, Ser: 0.82 mg/dL (ref 0.44–1.00)
GFR, Estimated: >60 mL/min (ref >60)
Glucose, Bld: 110 mg/dL — ABNORMAL HIGH (ref 70–99)
Potassium: 4.1 mmol/L (ref 3.5–5.1)
Sodium: 136 mmol/L (ref 135–145)
Total Bilirubin: <0.1 mg/dL — ABNORMAL LOW (ref 0.3–1.2)
Total Protein: 7.4 g/dL (ref 6.5–8.1)

## 2022-05-05 LAB — CBC WITH DIFFERENTIAL/PLATELET
Abs Immature Granulocytes: 0.06 10*3/uL (ref 0.00–0.07)
Basophils Absolute: 0 10*3/uL (ref 0.0–0.1)
Basophils Relative: 0 %
Eosinophils Absolute: 0.1 10*3/uL (ref 0.0–0.5)
Eosinophils Relative: 0 %
HCT: 38.7 % (ref 36.0–46.0)
Hemoglobin: 13.2 g/dL (ref 12.0–15.0)
Immature Granulocytes: 1 %
Lymphocytes Relative: 15 %
Lymphs Abs: 1.8 10*3/uL (ref 0.7–4.0)
MCH: 29.9 pg (ref 26.0–34.0)
MCHC: 34.1 g/dL (ref 30.0–36.0)
MCV: 87.6 fL (ref 80.0–100.0)
Monocytes Absolute: 1.5 10*3/uL — ABNORMAL HIGH (ref 0.1–1.0)
Monocytes Relative: 13 %
Neutro Abs: 8.3 10*3/uL — ABNORMAL HIGH (ref 1.7–7.7)
Neutrophils Relative %: 71 %
Platelets: 271 10*3/uL (ref 150–400)
RBC: 4.42 MIL/uL (ref 3.87–5.11)
RDW: 13.2 % (ref 11.5–15.5)
WBC: 11.8 10*3/uL — ABNORMAL HIGH (ref 4.0–10.5)
nRBC: 0 % (ref 0.0–0.2)

## 2022-05-05 LAB — CBG MONITORING, ED: Glucose-Capillary: 117 mg/dL — ABNORMAL HIGH (ref 70–99)

## 2022-05-05 LAB — TROPONIN I (HIGH SENSITIVITY)
Troponin I (High Sensitivity): 5 ng/L (ref <18)
Troponin I (High Sensitivity): 5 ng/L (ref <18)

## 2022-05-05 LAB — C-REACTIVE PROTEIN: CRP: 0.6 mg/dL (ref <1.0)

## 2022-05-05 LAB — MAGNESIUM: Magnesium: 2.1 mg/dL (ref 1.7–2.4)

## 2022-05-05 MED ORDER — OXYCODONE-ACETAMINOPHEN 5-325 MG PO TABS
1.0000 | ORAL_TABLET | Freq: Once | ORAL | Status: AC
  Administered 2022-05-05: 1 via ORAL
  Filled 2022-05-05: qty 1

## 2022-05-05 MED ORDER — LIDOCAINE-EPINEPHRINE (PF) 2 %-1:200000 IJ SOLN
10.0000 mL | Freq: Once | INTRAMUSCULAR | Status: AC
  Administered 2022-05-05: 10 mL via INTRADERMAL
  Filled 2022-05-05: qty 20

## 2022-05-06 ENCOUNTER — Other Ambulatory Visit (HOSPITAL_COMMUNITY): Payer: Self-pay

## 2022-05-06 ENCOUNTER — Observation Stay (HOSPITAL_BASED_OUTPATIENT_CLINIC_OR_DEPARTMENT_OTHER): Payer: Self-pay

## 2022-05-06 ENCOUNTER — Observation Stay (HOSPITAL_COMMUNITY): Payer: Self-pay

## 2022-05-06 ENCOUNTER — Other Ambulatory Visit: Payer: Self-pay | Admitting: Cardiology

## 2022-05-06 DIAGNOSIS — I639 Cerebral infarction, unspecified: Secondary | ICD-10-CM

## 2022-05-06 DIAGNOSIS — R55 Syncope and collapse: Secondary | ICD-10-CM

## 2022-05-06 DIAGNOSIS — D1720 Benign lipomatous neoplasm of skin and subcutaneous tissue of unspecified limb: Secondary | ICD-10-CM | POA: Diagnosis present

## 2022-05-06 DIAGNOSIS — M25532 Pain in left wrist: Secondary | ICD-10-CM

## 2022-05-06 DIAGNOSIS — R079 Chest pain, unspecified: Secondary | ICD-10-CM | POA: Diagnosis present

## 2022-05-06 DIAGNOSIS — R634 Abnormal weight loss: Secondary | ICD-10-CM

## 2022-05-06 DIAGNOSIS — I10 Essential (primary) hypertension: Secondary | ICD-10-CM

## 2022-05-06 DIAGNOSIS — E860 Dehydration: Secondary | ICD-10-CM | POA: Diagnosis present

## 2022-05-06 LAB — CBC
HCT: 35.6 % — ABNORMAL LOW (ref 36.0–46.0)
Hemoglobin: 11.9 g/dL — ABNORMAL LOW (ref 12.0–15.0)
MCH: 29.4 pg (ref 26.0–34.0)
MCHC: 33.4 g/dL (ref 30.0–36.0)
MCV: 87.9 fL (ref 80.0–100.0)
Platelets: 264 10*3/uL (ref 150–400)
RBC: 4.05 MIL/uL (ref 3.87–5.11)
RDW: 13.4 % (ref 11.5–15.5)
WBC: 8.4 10*3/uL (ref 4.0–10.5)
nRBC: 0 % (ref 0.0–0.2)

## 2022-05-06 LAB — COMPREHENSIVE METABOLIC PANEL
ALT: 12 U/L (ref 0–44)
AST: 20 U/L (ref 15–41)
Albumin: 2.9 g/dL — ABNORMAL LOW (ref 3.5–5.0)
Alkaline Phosphatase: 46 U/L (ref 38–126)
Anion gap: 8 (ref 5–15)
BUN: 8 mg/dL (ref 8–23)
CO2: 24 mmol/L (ref 22–32)
Calcium: 8.6 mg/dL — ABNORMAL LOW (ref 8.9–10.3)
Chloride: 107 mmol/L (ref 98–111)
Creatinine, Ser: 0.65 mg/dL (ref 0.44–1.00)
GFR, Estimated: >60 mL/min (ref >60)
Glucose, Bld: 110 mg/dL — ABNORMAL HIGH (ref 70–99)
Potassium: 3.7 mmol/L (ref 3.5–5.1)
Sodium: 139 mmol/L (ref 135–145)
Total Bilirubin: 0.6 mg/dL (ref 0.3–1.2)
Total Protein: 6.6 g/dL (ref 6.5–8.1)

## 2022-05-06 LAB — LIPID PANEL
Cholesterol: 163 mg/dL (ref 0–200)
HDL: 58 mg/dL (ref >40)
LDL Cholesterol: 95 mg/dL (ref 0–99)
Total CHOL/HDL Ratio: 2.8 RATIO
Triglycerides: 52 mg/dL (ref <150)
VLDL: 10 mg/dL (ref 0–40)

## 2022-05-06 LAB — SEDIMENTATION RATE: Sed Rate: 9 mm/hr (ref 0–22)

## 2022-05-06 LAB — URINALYSIS, COMPLETE (UACMP) WITH MICROSCOPIC
Bacteria, UA: NONE SEEN
Bilirubin Urine: NEGATIVE
Glucose, UA: NEGATIVE mg/dL
Hgb urine dipstick: NEGATIVE
Ketones, ur: NEGATIVE mg/dL
Leukocytes,Ua: NEGATIVE
Nitrite: NEGATIVE
Protein, ur: NEGATIVE mg/dL
Specific Gravity, Urine: 1.009 (ref 1.005–1.030)
pH: 7 (ref 5.0–8.0)

## 2022-05-06 LAB — CK: Total CK: 66 U/L (ref 38–234)

## 2022-05-06 LAB — ECHOCARDIOGRAM COMPLETE
Area-P 1/2: 4.49 cm2
Calc EF: 45.4 %
Height: 67 in
P 1/2 time: 398 msec
S' Lateral: 3.6 cm
Single Plane A2C EF: 54.6 %
Single Plane A4C EF: 35.8 %
Weight: 1446.22 oz

## 2022-05-06 LAB — SYNOVIAL CELL COUNT + DIFF, W/ CRYSTALS
Crystals, Fluid: NONE SEEN
Eosinophils-Synovial: UNDETERMINED % (ref 0–1)
Lymphocytes-Synovial Fld: UNDETERMINED % (ref 0–20)
Monocyte-Macrophage-Synovial Fluid: UNDETERMINED % (ref 50–90)
Neutrophil, Synovial: UNDETERMINED % (ref 0–25)
Other Cells-SYN: UNDETERMINED
WBC, Synovial: UNDETERMINED /mm3 (ref 0–200)

## 2022-05-06 LAB — HEMOGLOBIN A1C
Hgb A1c MFr Bld: 5.6 % (ref 4.8–5.6)
Mean Plasma Glucose: 114.02 mg/dL

## 2022-05-06 LAB — TSH: TSH: 4.079 u[IU]/mL (ref 0.350–4.500)

## 2022-05-06 LAB — CBG MONITORING, ED: Glucose-Capillary: 114 mg/dL — ABNORMAL HIGH (ref 70–99)

## 2022-05-06 LAB — PREALBUMIN: Prealbumin: 23 mg/dL (ref 18–38)

## 2022-05-06 LAB — D-DIMER, QUANTITATIVE: D-Dimer, Quant: 0.43 ug/mL-FEU (ref 0.00–0.50)

## 2022-05-06 LAB — PROTIME-INR
INR: 1.1 (ref 0.8–1.2)
Prothrombin Time: 13.6 seconds (ref 11.4–15.2)

## 2022-05-06 LAB — PHOSPHORUS: Phosphorus: 4.4 mg/dL (ref 2.5–4.6)

## 2022-05-06 MED ORDER — ROSUVASTATIN CALCIUM 40 MG PO TABS: 40.0000 mg | ORAL_TABLET | Freq: Every day | ORAL | 1 refills | Status: AC

## 2022-05-06 MED ORDER — GADOBUTROL 1 MMOL/ML IV SOLN
4.5000 mL | Freq: Once | INTRAVENOUS | Status: AC | PRN
  Administered 2022-05-06: 4.5 mL via INTRAVENOUS

## 2022-05-06 MED ORDER — ROSUVASTATIN CALCIUM 20 MG PO TABS
40.0000 mg | ORAL_TABLET | Freq: Every day | ORAL | Status: DC
  Administered 2022-05-06: 40 mg via ORAL
  Filled 2022-05-06: qty 2

## 2022-05-06 MED ORDER — METOPROLOL TARTRATE 25 MG PO TABS: 12.5000 mg | ORAL_TABLET | Freq: Two times a day (BID) | ORAL | 1 refills | Status: AC

## 2022-05-06 MED ORDER — SODIUM CHLORIDE 0.9 % IV SOLN: INTRAVENOUS | Status: AC

## 2022-05-06 MED ORDER — HYDROCODONE-ACETAMINOPHEN 5-325 MG PO TABS: 1.0000 | ORAL_TABLET | ORAL | Status: DC | PRN

## 2022-05-06 MED ORDER — IOHEXOL 350 MG/ML SOLN
75.0000 mL | Freq: Once | INTRAVENOUS | Status: AC | PRN
  Administered 2022-05-06: 75 mL via INTRAVENOUS

## 2022-05-06 MED ORDER — ASPIRIN 81 MG PO CHEW: 81.0000 mg | CHEWABLE_TABLET | Freq: Every day | ORAL | 1 refills | Status: AC

## 2022-05-06 MED ORDER — METOPROLOL TARTRATE 25 MG PO TABS
12.5000 mg | ORAL_TABLET | Freq: Two times a day (BID) | ORAL | Status: DC
  Administered 2022-05-06: 12.5 mg via ORAL
  Filled 2022-05-06: qty 1

## 2022-05-06 MED ORDER — IOHEXOL 9 MG/ML PO SOLN: 500.0000 mL | ORAL | Status: DC

## 2022-05-06 MED ORDER — ACETAMINOPHEN 325 MG PO TABS: 650.0000 mg | ORAL_TABLET | Freq: Four times a day (QID) | ORAL | Status: DC | PRN

## 2022-05-06 MED ORDER — ACETAMINOPHEN 650 MG RE SUPP: 650.0000 mg | Freq: Four times a day (QID) | RECTAL | Status: DC | PRN

## 2022-05-06 MED ORDER — ASPIRIN 81 MG PO CHEW
81.0000 mg | CHEWABLE_TABLET | Freq: Every day | ORAL | Status: DC
  Administered 2022-05-06: 81 mg via ORAL
  Filled 2022-05-06: qty 1

## 2022-05-07 ENCOUNTER — Encounter: Payer: Self-pay | Admitting: Orthopedic Surgery

## 2022-05-07 NOTE — Discharge Summary (Signed)
Physician AGAINST MEDICAL ADVICE Discharge Summary  Carolyn Evans WIO:[id number] DOB: Mar 02, 1936 DOA: 05/05/2022  PCP: Patient, No Pcp Per  Admit date: 05/05/2022 Discharge date: 05/07/2022  Admitted From: home Disposition:  home >>> LEFT AMA  Recommendations for Outpatient Follow-up:  Follow up with PCP ASAP to continue malignancy workup  Hospital Course / Discharge diagnoses: Principal Problem:   Syncope Active Problems:   Left wrist pain   Essential hypertension   Chest pain   Weight loss   Lipoma of lower leg   Dehydration   Brief Narrative / Interim history: 86 yo F with medical history of hypertension comes into the hospital left wrist pain.  This happened few days ago after a fall.  She also hurt her chest and was having chest pains.  She went to urgent care today, when sitting in the lobby she started complaining of chest pain, and had an episode that she almost passed out.  There was no loss of consciousness and it was brief lasting a few seconds.  She was admitted to the hospital and cardiology consulted   Principal problem Acute and subacute CVAs-due to reported dizziness, which may have contributed to her falls, underwent an MRI of the brain which showed small punctate CVAs in the left cerebellum and possibly the left frontal lobe suspicious for acute infarcts, as well as possibility of subacute infarct in the right cerebellum.  Neurology consulted and evaluated patient, underwent full stroke work-up with CT angiogram of the head and neck showing no LVO, 2D echo with EF 50-55%, no WMA, normal RV, A1c is 5.6, lipid panel shows an LDL of 95.  PT recommending home health services.  She was placed on a statin, as well as aspirin.  Neurology recommends a 30-day cardiac monitor, cardiology was contacted already    Active problems Syncope-possibly due to #1, due to chest pain cardiology consulted and evaluated patient.  They started her on metoprolol due to persistent PVCs.   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  Discussed with orthopedic surgery, for now recommending splint and supportive care.  Outpatient follow-up with Dr. Caralyn Guile in a week or 2   Hypertension-continue amlodipine and metoprolol.   Hyperlipidemia-now on statin   Concern for metastatic disease-brain imaging mentioned few punctate lesions metastatic disease in addition to her acute CVAs.  Imaging discussed with neurology.  Given weight loss, poor appetite, I have discussed with the family and the patient and will need further work-up.  Obtain MRI of the brain with contrast as well as CT chest abdomen and pelvis. Unfortunately patient left AMA after hours   Sepsis ruled out   Discharge Instructions  Discharge Instructions     Ambulatory referral to Neurology   Complete by: As directed    An appointment is requested in approximately: 8 weeks      Allergies as of 05/06/2022       Reactions   Aspirin         Medication List     TAKE these medications    amLODipine 5 MG tablet Commonly known as: NORVASC Take 5 mg by mouth daily.   aspirin 81 MG chewable tablet Chew 1 tablet (81 mg total) by mouth daily.   metoprolol tartrate 25 MG tablet Commonly known as: LOPRESSOR Take 0.5 tablets (12.5 mg total) by mouth 2 (two) times daily.   rosuvastatin 40 MG tablet Commonly known as: CRESTOR Take 1 tablet (40 mg total)  by mouth daily.        Follow-up Information     Iran Planas, MD Follow up in 1 week(s).   Specialty: Orthopedic Surgery Contact information: [address] [place] [address] [phone]                 Consultations: Neurology  Cardiology   Procedures/Studies:  MR BRAIN W CONTRAST  Result Date: 05/06/2022 CLINICAL DATA:  Abnormal MRI head today. Rule out metastatic disease. EXAM: MRI HEAD WITH CONTRAST TECHNIQUE: Multiplanar, multiecho pulse sequences of  the brain and surrounding structures were obtained with intravenous contrast. CONTRAST:  4.19m GADAVIST GADOBUTROL 1 MMOL/ML IV SOLN COMPARISON:  MRI head and CT head 05/06/2022 FINDINGS: Brain: Motion degraded study. Patient not able to hold still for the study. No enhancing lesions identified. The lesion in the right brachium pontis is not show abnormal enhancement and may be due to prior infarct. This shows increased signal on T2. There is also hyperintensity in the central pons which does not show abnormal enhancement Ventricle size is normal. Vascular: Normal arterial and venous enhancement. Skull and upper cervical spine: No enhancing skeletal lesion. Sinuses/Orbits: Negative Other: None IMPRESSION: No enhancing lesion is seen postcontrast. The lesion in the right brachium pontis is not enhance and may be due to chronic infarct or possibly demyelinating disease. Electronically Signed   By: CFranchot GalloM.D.   On: 05/06/2022 19:29   CT ANGIO HEAD NECK W WO CM  Result Date: 05/06/2022 CLINICAL DATA:  Acute neuro deficit.  Rule out stroke EXAM: CT ANGIOGRAPHY HEAD AND NECK TECHNIQUE: Multidetector CT imaging of the head and neck was performed using the standard protocol during bolus administration of intravenous contrast. Multiplanar CT image reconstructions and MIPs were obtained to evaluate the vascular anatomy. Carotid stenosis measurements (when applicable) are obtained utilizing NASCET criteria, using the distal internal carotid diameter as the denominator. RADIATION DOSE REDUCTION: This exam was performed according to the departmental dose-optimization program which includes automated exposure control, adjustment of the mA and/or kV according to patient size and/or use of iterative reconstruction technique. CONTRAST:  77mOMNIPAQUE IOHEXOL 350 MG/ML SOLN COMPARISON:  MRI head 05/06/2022 FINDINGS: CT HEAD FINDINGS Brain: Ventricle size normal.  No intracranial hemorrhage. Approximately 1 cm  hypodensity in the right brachium pontis corresponding to the abnormality on MRI. Small areas of restricted diffusion in the left cerebellum and left frontal lobe best seen on diffusion-weighted imaging. Vascular: Negative for hyperdense vessel Skull: Negative Sinuses/Orbits: Paranasal sinuses clear.  Negative orbit Other: None Review of the MIP images confirms the above findings CTA NECK FINDINGS Aortic arch: Mild atherosclerotic calcification aortic arch. Proximal great vessels widely patent Right carotid system: Right common carotid artery normal. Right carotid bifurcation normal. Tortuosity of the right internal carotid artery below the skull base with mild fusiform dilatation of the right internal carotid artery below the skull base. Possible mild fibromuscular dysplasia. No significant stenosis Left carotid system: Left common carotid artery normal. Left carotid bifurcation normal. Tortuosity left internal carotid artery below the skull base without stenosis. Vertebral arteries: Both vertebral arteries patent to the skull base without stenosis. Left vertebral artery dominant. Skeleton: Cervical spondylosis and kyphosis. No acute abnormality. Multiple dental caries. Other neck: Negative for soft tissue mass or edema Upper chest: Mild apical emphysema bilaterally. No acute abnormality. Review of the MIP images confirms the above findings CTA HEAD FINDINGS Anterior circulation: Mild atherosclerotic calcification in the carotid siphon bilaterally without stenosis. Anterior and middle cerebral arteries  widely patent without stenosis or large vessel occlusion. No aneurysm identified. Posterior circulation: Both vertebral arteries patent to the basilar. PICA patent bilaterally. Basilar patent. Superior cerebellar and posterior cerebral arteries patent bilaterally without stenosis. Right posterior communicating artery is patent. Venous sinuses: Normal venous enhancement Anatomic variants: None Review of the MIP images  confirms the above findings IMPRESSION: 1. 1 cm hypodensity right brachium pontis as noted on diffusion-weighted imaging. Possible subacute infarct or metastatic disease. Recommend follow-up MRI brain with contrast. Small areas of restricted diffusion in the left cerebellum and left frontal lobe best seen on diffusion-weighted imaging. 2. No significant carotid or vertebral artery stenosis in the neck. 3. No intracranial large vessel occlusion or significant stenosis. 4. Mild fusiform dilatation of the right internal carotid artery below the skull base. Possible mild fibromuscular dysplasia. 5. Aortic atherosclerosis. Aortic Atherosclerosis (ICD10-I70.0). Electronically Signed   By: Franchot Gallo M.D.   On: 05/06/2022 17:42   MR BRAIN WO CONTRAST  Result Date: 05/06/2022 EXAM: MRI HEAD WITHOUT CONTRAST TECHNIQUE: Multiplanar, multiecho pulse sequences of the brain and surrounding structures were obtained without intravenous contrast. COMPARISON:  None Available. FINDINGS: Brain: Punctate focus of restricted diffusion in the left cerebellum (series 2, image 17) and possibly in the high left frontal lobe (series 2, image 41), suspicious for acute infarcts. Additional area of peripheral restricted diffusion the right cerebellum (series 2, image 16). Mild edema without mass effect. Additional scattered T2/FLAIR hyperintensities within the white matter, nonspecific but compatible with chronic microvascular ischemic disease. No acute hemorrhage, hydrocephalus, midline shift. Remote right thalamic infarct. Patchy T2/FLAIR hyperintensities in the white matter, nonspecific but compatible with chronic microvascular ischemic disease. Vascular: Major arterial flow voids are maintained at the skull base. Skull and upper cervical spine: Normal marrow signal. Sinuses/Orbits: Clear sinuses.  No acute orbital findings. Other: No mastoid effusions. IMPRESSION: 1. Punctate focus of restricted diffusion in the left cerebellum and  possibly in the high left frontal lobe, suspicious for acute infarcts. 2. Additional area of peripheral restricted diffusion the right cerebellum. This probably represents an evolving subacute infarcts; however, given the peripheral restricted diffusion and other foci of restricted diffusion, recommend postcontrast imaging to exclude metastases. Electronically Signed   By: Margaretha Sheffield M.D.   On: 05/06/2022 14:58   ECHOCARDIOGRAM COMPLETE  Result Date: 05/06/2022    ECHOCARDIOGRAM REPORT   Patient Name:   Carolyn Evans   Date of Exam: 05/06/2022 Medical Rec #:  [id number]  Height:       67.0 in Accession #:    [id number] Weight:       90.4 lb Date of Birth:  03-28-1936   BSA:          1.443 m Patient Age:    20 years   BP:           140/75 mmHg Patient Gender: F          HR:           61 bpm. Exam Location:  Inpatient Procedure: 2D Echo, Color Doppler and Cardiac Doppler Indications:    syncope  History:        Patient has no prior history of Echocardiogram examinations.                 Signs/Symptoms:Syncope; Risk Factors:Hypertension.  Sonographer:    Melissa Morford RDCS (AE, PE) Referring Phys: [place]  1. Left ventricular ejection fraction, by estimation, is 50 to 55%. The left ventricle has low normal  function. The left ventricle has no regional wall motion abnormalities. Left ventricular diastolic parameters are consistent with Grade I diastolic dysfunction (impaired relaxation).  2. Right ventricular systolic function is normal. The right ventricular size is normal. There is mildly elevated pulmonary artery systolic pressure.  3. Left atrial size was moderately dilated.  4. The mitral valve is normal in structure. Trivial mitral valve regurgitation. No evidence of mitral stenosis.  5. The aortic valve is tricuspid. There is mild calcification of the aortic valve. There is mild thickening of the aortic valve. Aortic valve regurgitation is mild to moderate. Aortic valve  sclerosis/calcification is present, without any evidence of aortic stenosis.  6. The inferior vena cava is normal in size with greater than 50% respiratory variability, suggesting right atrial pressure of 3 mmHg.  7. Cannot exclude a small PFO. Comparison(s): No prior Echocardiogram. FINDINGS  Left Ventricle: Left ventricular ejection fraction, by estimation, is 50 to 55%. The left ventricle has low normal function. The left ventricle has no regional wall motion abnormalities. The left ventricular internal cavity size was normal in size. There is no left ventricular hypertrophy. Left ventricular diastolic parameters are consistent with Grade I diastolic dysfunction (impaired relaxation). Right Ventricle: The right ventricular size is normal. No increase in right ventricular wall thickness. Right ventricular systolic function is normal. There is mildly elevated pulmonary artery systolic pressure. The tricuspid regurgitant velocity is 2.96  m/s, and with an assumed right atrial pressure of 3 mmHg, the estimated right ventricular systolic pressure is 87.5 mmHg. Left Atrium: Left atrial size was moderately dilated. Right Atrium: Right atrial size was normal in size. Pericardium: There is no evidence of pericardial effusion. Mitral Valve: The mitral valve is normal in structure. Trivial mitral valve regurgitation. No evidence of mitral valve stenosis. Tricuspid Valve: The tricuspid valve is grossly normal. Tricuspid valve regurgitation is mild . No evidence of tricuspid stenosis. Aortic Valve: The aortic valve is tricuspid. There is mild calcification of the aortic valve. There is mild thickening of the aortic valve. Aortic valve regurgitation is mild to moderate. Aortic regurgitation PHT measures 398 msec. Aortic valve sclerosis/calcification is present, without any evidence of aortic stenosis. Pulmonic Valve: The pulmonic valve was grossly normal. Pulmonic valve regurgitation is mild. No evidence of pulmonic stenosis.  Aorta: The aortic root, ascending aorta, aortic arch and descending aorta are all structurally normal, with no evidence of dilitation or obstruction. Venous: The inferior vena cava is normal in size with greater than 50% respiratory variability, suggesting right atrial pressure of 3 mmHg. IAS/Shunts: There is right bowing of the interatrial septum, suggestive of elevated left atrial pressure. Cannot exclude a small PFO.  LEFT VENTRICLE PLAX 2D LVIDd:         4.60 cm     Diastology LVIDs:         3.60 cm     LV e' medial:    6.64 cm/s LV PW:         1.00 cm     LV E/e' medial:  10.1 LV IVS:        0.90 cm     LV e' lateral:   7.51 cm/s LVOT diam:     2.00 cm     LV E/e' lateral: 8.9 LV SV:         57 LV SV Index:   40 LVOT Area:     3.14 cm  LV Volumes (MOD) LV vol d, MOD A2C: 78.5 ml LV vol d, MOD A4C: 65.0  ml LV vol s, MOD A2C: 35.6 ml LV vol s, MOD A4C: 41.7 ml LV SV MOD A2C:     42.9 ml LV SV MOD A4C:     65.0 ml LV SV MOD BP:      33.0 ml RIGHT VENTRICLE RV S prime:     16.70 cm/s TAPSE (M-mode): 1.6 cm LEFT ATRIUM             Index        RIGHT ATRIUM           Index LA diam:        3.10 cm 2.15 cm/m   RA Area:     10.10 cm LA Vol (A2C):   59.8 ml 41.44 ml/m  RA Volume:   18.20 ml  12.61 ml/m LA Vol (A4C):   57.9 ml 40.13 ml/m LA Biplane Vol: 60.7 ml 42.07 ml/m  AORTIC VALVE LVOT Vmax:   96.60 cm/s LVOT Vmean:  67.900 cm/s LVOT VTI:    0.183 m AI PHT:      398 msec  AORTA Ao Root diam: 3.10 cm Ao Asc diam:  2.80 cm MITRAL VALVE                TRICUSPID VALVE MV Area (PHT): 4.49 cm     TR Peak grad:   35.0 mmHg MV Decel Time: 169 msec     TR Vmax:        296.00 cm/s MV E velocity: 66.80 cm/s MV A velocity: 105.00 cm/s  SHUNTS MV E/A ratio:  0.64         Systemic VTI:  0.18 m                             Systemic Diam: 2.00 cm Buford Dresser MD Electronically signed by Buford Dresser MD Signature Date/Time: 05/06/2022/12:48:55 PM    Final    VAS US CAROTID  Result Date:  05/06/2022 Carotid Arterial Duplex Study Patient Name:  Carolyn Evans   Date of Exam:   05/06/2022 Medical Rec #: [id number]  Accession #:    [id number] Date of Birth: 1935/11/01   Patient Gender: F Patient Age:   18 years Exam Location:  [facility] Procedure:      VAS US CAROTID Referring Phys: Marzetta Board --------------------------------------------------------------------------------  Indications:       Syncope. Risk Factors:      Hypertension, no history of smoking. Limitations        Today's exam was limited due to the high bifurcation of the                    carotid and the body habitus of the patient. Comparison Study:  No previous exams Performing Technologist: Jody Hill RVT, RDMS  Examination Guidelines: A complete evaluation includes B-mode imaging, spectral Doppler, color Doppler, and power Doppler as needed of all accessible portions of each vessel. Bilateral testing is considered an integral part of a complete examination. Limited examinations for reoccurring indications may be performed as noted.  Right Carotid Findings: +----------+--------+--------+--------+------------------+--------+           PSV cm/sEDV cm/sStenosisPlaque DescriptionComments +----------+--------+--------+--------+------------------+--------+ CCA Prox  86      9                                          +----------+--------+--------+--------+------------------+--------+  CCA Distal82      9                                          +----------+--------+--------+--------+------------------+--------+ ICA Prox  75      13                                         +----------+--------+--------+--------+------------------+--------+ ICA Distal61      14                                         +----------+--------+--------+--------+------------------+--------+ ECA       50                                                 +----------+--------+--------+--------+------------------+--------+  +----------+--------+-------+----------------+-------------------+           PSV cm/sEDV cmsDescribe        Arm Pressure (mmHG) +----------+--------+-------+----------------+-------------------+ [id number]             Multiphasic, WNL                    +----------+--------+-------+----------------+-------------------+ +---------+--------+--+--------+-+---------+ VertebralPSV cm/s63EDV cm/s8Antegrade +---------+--------+--+--------+-+---------+  Left Carotid Findings: +----------+--------+--------+--------+------------------+--------+           PSV cm/sEDV cm/sStenosisPlaque DescriptionComments +----------+--------+--------+--------+------------------+--------+ CCA Prox  97      7                                          +----------+--------+--------+--------+------------------+--------+ CCA Distal74      10                                         +----------+--------+--------+--------+------------------+--------+ ICA Prox  49      10                                         +----------+--------+--------+--------+------------------+--------+ ICA Distal50      11                                         +----------+--------+--------+--------+------------------+--------+ ECA       68                                                 +----------+--------+--------+--------+------------------+--------+ +----------+--------+--------+----------------+-------------------+           PSV cm/sEDV cm/sDescribe        Arm Pressure (mmHG) +----------+--------+--------+----------------+-------------------+ [id number]              Multiphasic, WNL                    +----------+--------+--------+----------------+-------------------+ +---------+--------+--+--------+--+---------+  VertebralPSV cm/s43EDV cm/s12Antegrade +---------+--------+--+--------+--+---------+   Summary: Right Carotid: The extracranial vessels were near-normal with only minimal wall                 thickening or plaque. Left Carotid: The extracranial vessels were near-normal with only minimal wall               thickening or plaque. Vertebrals:  Bilateral vertebral arteries demonstrate antegrade flow. Subclavians: Normal flow hemodynamics were seen in bilateral subclavian              arteries. *See table(s) above for measurements and observations.  Electronically signed by Antony Contras MD on 05/06/2022 at 12:28:35 PM.    Final    DG Chest 2 View  Result Date: 05/05/2022 CLINICAL DATA:  Syncope EXAM: CHEST - 2 VIEW COMPARISON:  None Available. FINDINGS: Lungs are symmetrically expanded. Mild interstitial opacities at the posterior basal lung bases bilaterally likely represent mild fibrotic change. The lungs are otherwise clear. No pneumothorax or pleural effusion. Cardiac size within normal limits. Pulmonary vascularity is normal. Osseous structures are age-appropriate. No acute bone abnormality. IMPRESSION: 1. No active cardiopulmonary disease. Electronically Signed   By: Fidela Salisbury M.D.   On: 05/05/2022 22:04   DG Wrist Complete Left  Result Date: 05/05/2022 CLINICAL DATA:  Pain. Reason for exam is pain in left wrist due to fall that occurred a month ago. Limited mobility of left wrist. EXAM: LEFT WRIST - COMPLETE 3+ VIEW; LEFT FOREARM - 2 VIEW COMPARISON:  None Available. FINDINGS: Left forearm: There is no evidence of fracture or dislocation. There is no evidence of arthropathy or other focal bone abnormality. Soft tissues are unremarkable. Left wrist: No evidence of fracture, dislocation, or joint effusion. Severe ulnocarpal and radiocarpal joint degenerative changes. Shortening of the distal radioulnar joint. Scapholunate interval widening. Mild proximal migration of the capitate and lunate. No aggressive appearing focal bone abnormality. Soft tissues are unremarkable. IMPRESSION: 1. No acute displaced fracture or dislocation of the bones of the left wrist and forearm. 2.  Suggestion of scapholunate advanced collapse. Electronically Signed   By: Iven Finn M.D.   On: 05/05/2022 21:19   DG Forearm Left  Result Date: 05/05/2022 CLINICAL DATA:  Pain. Reason for exam is pain in left wrist due to fall that occurred a month ago. Limited mobility of left wrist. EXAM: LEFT WRIST - COMPLETE 3+ VIEW; LEFT FOREARM - 2 VIEW COMPARISON:  None Available. FINDINGS: Left forearm: There is no evidence of fracture or dislocation. There is no evidence of arthropathy or other focal bone abnormality. Soft tissues are unremarkable. Left wrist: No evidence of fracture, dislocation, or joint effusion. Severe ulnocarpal and radiocarpal joint degenerative changes. Shortening of the distal radioulnar joint. Scapholunate interval widening. Mild proximal migration of the capitate and lunate. No aggressive appearing focal bone abnormality. Soft tissues are unremarkable. IMPRESSION: 1. No acute displaced fracture or dislocation of the bones of the left wrist and forearm. 2. Suggestion of scapholunate advanced collapse. Electronically Signed   By: Iven Finn M.D.   On: 05/05/2022 21:19       The results of significant diagnostics from this hospitalization (including imaging, microbiology, ancillary and laboratory) are listed below for reference.     Microbiology: Recent Results (from the past 240 hour(s))  Body fluid culture w Gram Stain     Status: None (Preliminary result)   Collection Time: 05/05/22 11:45 PM   Specimen: Synovium; Body Fluid  Result Value Ref Range Status   Specimen  Description SYNOVIAL FLUID  Final   Special Requests WRIST  Final   Gram Stain   Final    FEW WBC PRESENT,BOTH PMN AND MONONUCLEAR NO ORGANISMS SEEN Performed at [facility], [address], [place], [place] [address]    Culture PENDING  Incomplete   Report Status PENDING  Incomplete     Labs: Basic Metabolic Panel: Recent Labs  Lab 05/05/22 2015 05/06/22 0600  NA 136 139  K 4.1 3.7   CL 103 107  CO2 25 24  GLUCOSE 110* 110*  BUN 11 8  CREATININE 0.82 0.65  CALCIUM 9.2 8.6*  MG 2.1  --   PHOS 4.4  --    Liver Function Tests: Recent Labs  Lab 05/05/22 2015 05/06/22 0600  AST 28 20  ALT 15 12  ALKPHOS 58 46  BILITOT <0.1* 0.6  PROT 7.4 6.6  ALBUMIN 3.5 2.9*   CBC: Recent Labs  Lab 05/05/22 2015 05/06/22 0600  WBC 11.8* 8.4  NEUTROABS 8.3*  --   HGB 13.2 11.9*  HCT 38.7 35.6*  MCV 87.6 87.9  PLT 271 264   CBG: Recent Labs  Lab 05/05/22 1904 05/06/22 0126  GLUCAP 117* 114*   Hgb A1c Recent Labs    05/05/22 2015  HGBA1C 5.6   Lipid Profile Recent Labs    05/05/22 2015  CHOL 163  HDL 58  LDLCALC 95  TRIG 52  CHOLHDL 2.8   Thyroid function studies Recent Labs    05/05/22 2015  TSH 4.079   Urinalysis    Component Value Date/Time   COLORURINE YELLOW 05/06/2022 0803   APPEARANCEUR CLEAR 05/06/2022 0803   LABSPEC 1.009 05/06/2022 0803   PHURINE 7.0 05/06/2022 0803   GLUCOSEU NEGATIVE 05/06/2022 0803   HGBUR NEGATIVE 05/06/2022 0803   BILIRUBINUR NEGATIVE 05/06/2022 0803   KETONESUR NEGATIVE 05/06/2022 0803   PROTEINUR NEGATIVE 05/06/2022 0803   NITRITE NEGATIVE 05/06/2022 0803   LEUKOCYTESUR NEGATIVE 05/06/2022 0803    FURTHER DISCHARGE INSTRUCTIONS:   Get Medicines reviewed and adjusted: Please take all your medications with you for your next visit with your Primary MD   Laboratory/radiological data: Please request your Primary MD to go over all hospital tests and procedure/radiological results at the follow up, please ask your Primary MD to get all Hospital records sent to his/her office.   In some cases, they will be blood work, cultures and biopsy results pending at the time of your discharge. Please request that your primary care M.D. goes through all the records of your hospital data and follows up on these results.   Also Note the following: If you experience worsening of your admission symptoms, develop  shortness of breath, life threatening emergency, suicidal or homicidal thoughts you must seek medical attention immediately by calling 911 or calling your MD immediately  if symptoms less severe.   You must read complete instructions/literature along with all the possible adverse reactions/side effects for all the Medicines you take and that have been prescribed to you. Take any new Medicines after you have completely understood and accpet all the possible adverse reactions/side effects.    Do not drive when taking Pain medications or sleeping medications (Benzodaizepines)   Do not take more than prescribed Pain, Sleep and Anxiety Medications. It is not advisable to combine anxiety,sleep and pain medications without talking with your primary care practitioner   Special Instructions: If you have smoked or chewed Tobacco  in the last 2 yrs please stop smoking, stop  any regular Alcohol  and or any Recreational drug use.   Wear Seat belts while driving.   Please note: You were cared for by a hospitalist during your hospital stay. Once you are discharged, your primary care physician will handle any further medical issues. Please note that NO REFILLS for any discharge medications will be authorized once you are discharged, as it is imperative that you return to your primary care physician (or establish a relationship with a primary care physician if you do not have one) for your post hospital discharge needs so that they can reassess your need for medications and monitor your lab values.  Time coordinating discharge: 10 minutes  SIGNED:  Marzetta Board, MD, PhD 05/07/2022, 7:25 AM

## 2022-05-09 LAB — BODY FLUID CULTURE W GRAM STAIN: Culture: NO GROWTH

## 2022-06-28 ENCOUNTER — Other Ambulatory Visit: Payer: Self-pay | Admitting: Family Medicine

## 2022-06-28 DIAGNOSIS — I10 Essential (primary) hypertension: Secondary | ICD-10-CM

## 2022-07-03 ENCOUNTER — Ambulatory Visit (INDEPENDENT_AMBULATORY_CARE_PROVIDER_SITE_OTHER): Payer: Self-pay | Admitting: Neurology

## 2022-07-03 VITALS — BP 156/86 | HR 80 | Ht 67.0 in | Wt 104.5 lb

## 2022-07-03 DIAGNOSIS — I6381 Other cerebral infarction due to occlusion or stenosis of small artery: Secondary | ICD-10-CM

## 2022-07-03 DIAGNOSIS — G3184 Mild cognitive impairment, so stated: Secondary | ICD-10-CM

## 2022-07-03 DIAGNOSIS — R42 Dizziness and giddiness: Secondary | ICD-10-CM

## 2022-07-03 NOTE — Progress Notes (Signed)
[facility] [address] [place]. [place] [address] [phone]       OFFICE CONSULT NOTE  Carolyn Evans Date of Birth:  Dec 02, 1935 Medical Record Number:  [id number]   Referring MD:  Marzetta Board  Reason for Referral:  Stroke  HPI: Carolyn Evans is an elderly [place] African lady from [place] is seen for initial office consultation visit today accompanied by her son.  Patient does not speak English and her son translates for her.  I personally reviewed electronic medical records and pertinent available imaging films in PACS.Carolyn Evans is a 87 y.o. female with PMHx of HTN who presented on 05/06/2022 with acute on chronic left wrist pain caused by a fall 1 month ago. Patient's daughter in law was at bedside and assisted in translating for the patient.  According to daughter-in-law, patient is a poor historian, has poor recall of events.  Patient lives with her son, daughter in law (not the daughter in law present in room), and grandson.  It was difficult to obtain an accurate history, as patient's family member does not live with her and is unclear of any past medical conditions or medications that she is currently taking.  Overall, she explained that the patient has had worsening dizziness that caused her fall a month ago, patient has frequent falls related to this. She does not use a walker at home. Daughter-in-law attributes these dizzy spells to poor oral intake, notes that patient does not eat or drink properly, sometimes for 1-2 days.  Denies any impact to head or loss of consciousness when these dizzy spells occur.  Patient denies any chest pain or headaches presently.  Also denies any dizziness at the moment.  According to daughter-in-law, patient has also had a recent decline in cognition, is often forgetful and requires assistance around the house.  Prior to this patient was independent, able to carry out ADLs and IADLs.  MRI scan of the brain showed tiny punctate acute left  cerebellar as well as left frontal white matter lacunar infarcts as well as subacute right brachium pontis infarct and remote age old right thalamic lacunar infarct.  CT angiogram shows no significant large vessel stenosis or occlusion.  LDL cholesterol is 95 mg percent.  Hemoglobin A1c was 5.6.  Carotid ultrasound shows no significant extracranial stenosis.  Transthoracic echo showed ejection fraction of 50 to 55% and telemetry monitoring did not show any atrial fibrillation.  Patient was started on aspirin and Plavix for 3 weeks followed by aspirin.  She is currently living at home.  The patient has not had significant improvement in her walking and balance.  She is walks minimally now and has to hold onto family members.  No falls or injuries.  Has also noticed that her cognitive decline has.  Patient states she may have had a stroke a few years ago when she was back in [place] but family does not know the details.  Patient has not had any evaluation for cognitive impairment or dementia yet.    ROS:   14 system review of systems is positive for dizziness, imbalance, memory loss, speech difficulties and all other systems negative  PMH:  Past Medical History:  Diagnosis Date   Hypertension 2000   Hypertension     Social History:  Social History   Socioeconomic History   Marital status: Single    Spouse name: Not on file   Number of children: Not on file   Years of education: Not on file  Highest education level: Not on file  Occupational History   Not on file  Tobacco Use   Smoking status: Never   Smokeless tobacco: Never  Substance and Sexual Activity   Alcohol use: Never   Drug use: Never   Sexual activity: Not Currently  Other Topics Concern   Not on file  Social History Narrative   ** Merged History Encounter **       Social Determinants of Health   Financial Resource Strain: Not on file  Food Insecurity: Not on file  Transportation Needs: Not on file  Physical  Activity: Not on file  Stress: Not on file  Social Connections: Not on file  Intimate Partner Violence: Not on file    Medications:   Current Outpatient Medications on File Prior to Visit  Medication Sig Dispense Refill   amLODipine (NORVASC) 5 MG tablet TAKE 2 TABLETS(10 MG) BY MOUTH DAILY 60 tablet 2   amLODipine (NORVASC) 5 MG tablet Take 5 mg by mouth daily.     aspirin 81 MG chewable tablet Chew 1 tablet (81 mg total) by mouth daily. 30 tablet 1   diclofenac Sodium (VOLTAREN) 1 % GEL Apply 4 g topically 4 (four) times daily. 100 g 0   metoprolol tartrate (LOPRESSOR) 25 MG tablet Take 0.5 tablets (12.5 mg total) by mouth 2 (two) times daily. 30 tablet 1   mirtazapine (REMERON) 7.5 MG tablet Take 1 tablet (7.5 mg total) by mouth at bedtime. 30 tablet 1   rosuvastatin (CRESTOR) 40 MG tablet Take 1 tablet (40 mg total) by mouth daily. 30 tablet 1   No current facility-administered medications on file prior to visit.    Allergies:   Allergies  Allergen Reactions   Aspirin     Physical Exam General: well developed, well nourished, seated, in no evident distress Head: head normocephalic and atraumatic.   Neck: supple with no carotid or supraclavicular bruits Cardiovascular: regular rate and rhythm, no murmurs Musculoskeletal: no deformity Skin:  no rash/petichiae Vascular:  Normal pulses all extremities  Neurologic Exam Mental Status: Awake and fully alert. Disoriented to place and time. Recent and remote memory ipoor Attention span, concentration and fund of knowledge diminished. Mood and affect appropriate.  Mini-Mental status exam (done with assistance from Dr. April Manson who speaks.  patient's native language)  11/30 Cranial Nerves: Fundoscopic exam reveals sharp disc margins. Pupils equal, briskly reactive to light. Extraocular movements full without nystagmus. Visual fields full to confrontation. Hearing intact. Facial sensation intact. Face, tongue, palate moves normally and  symmetrically.  Motor: Normal bulk and tone. Normal strength in all tested extremity muscles. Sensory.: intact to touch , pinprick , position and vibratory sensation.  Coordination: Rapid alternating movements normal in all extremities. Finger-to-nose and heel-to-shin performed accurately bilaterally. Gait and Station: Deferred as patient is one-person assist and did not bring walker Reflexes: 1+ and symmetric. Toes downgoing.   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 I also advised the patient to eat a healthy diet with plenty of whole grains, cereals, fruits and vegetables, exercise regularly and maintain ideal body weight .refer to physical and Occupational Therapy to improve gait and balance.  Check memory panel labs.  Patient is planning to return to [place] soon.  Followup in the future with me in 6 months upon her return or call earlier if necessary.  Greater than 50% time during this prolonged 60-minute consultation visit was spent on counseling and coordination of care about her lacunar strokes and dizziness as well as cognitive impairment and answering questions. Antony Contras, MD  Note: This document was prepared  with digital dictation and possible smart phrase technology. Any transcriptional errors that result from this process are unintentional.

## 2022-07-03 NOTE — Patient Instructions (Signed)
I had a long d/w patient  using his son as a Optometrist about her  recent lacunar  strokes, dizziness and memory loss , risk for recurrent stroke/TIAs, personally independently reviewed imaging studies and stroke evaluation results and answered questions.Continue aspirin 81 mg daily  for secondary stroke prevention and maintain strict control of hypertension with blood pressure goal below 130/90, diabetes with hemoglobin A1c goal below 6.5% and lipids with LDL cholesterol goal below 70 mg/dL. I also advised the patient to eat a healthy diet with plenty of whole grains, cereals, fruits and vegetables, exercise regularly and maintain ideal body weight .refer to physical and Occupational Therapy to improve gait and balance.  Check memory panel labs.  Patient is planning to return to [place] soon.  Followup in the future with me in 6 months upon her return or call earlier if necessary.

## 2022-07-04 ENCOUNTER — Ambulatory Visit (HOSPITAL_BASED_OUTPATIENT_CLINIC_OR_DEPARTMENT_OTHER): Payer: Self-pay | Admitting: Cardiovascular Disease

## 2022-07-04 NOTE — Progress Notes (Incomplete)
Cardiology Office Note:    Date:  07/04/2022   ID:  Carolyn Evans, DOB 06-05-1936, MRN [id number]  PCP:  Patient, No Pcp Per   [facility] Providers Cardiologist:  None { Click to update primary MD,subspecialty MD or APP then REFRESH:1}    Referring MD: Colletta Maryland, MD   No chief complaint on file.   History of Present Illness:    Carolyn Evans is a 87 y.o. female with a hx of hypertension, and palpitations, here at the request of Colletta Maryland, MD for the evaluation of   She was seen in the hospital 04/2022 with chest pain, diaphoresis, and altered mental status. She had initially gone to urgent care with left wrist pain, after a fall on outstretched hands. Per her son, she became diaphoretic and had a few seconds of syncope. Imaging revealed acute and subacute strokes. Echo revealed LVEF 50-55% with grade 1 diastolic dysfunction. They were unable to exclude a small PFO. Carotid dopplers showed minimal plaque. She was started on aspirin, amlodipine, metoprolol, and rosuvastatin. A 30 day monitor was ordered but not completed.  She saw neurology 07/03/2022 regarding her recent lacunar strokes, dizziness, and memory loss. They recommended continuing aspirin and a blood pressure goal less than 140/90. At that visit her blood pressure was 156/86 on amlodipine, and metoprolol.  Today,  *** denies any palpitations, chest pain, shortness of breath, or peripheral edema. No lightheadedness, headaches, syncope, orthopnea, or PND.  (+)  Past Medical History:  Diagnosis Date   Hypertension 2000   Hypertension     No past surgical history on file.  Current Medications: No outpatient medications have been marked as taking for the 07/04/22 encounter (Appointment) with Skeet Latch, MD.     Allergies:   Aspirin   Social History   Socioeconomic History   Marital status: Single    Spouse name: Not on file   Number of children: Not on file   Years of education: Not on file    Highest education level: Not on file  Occupational History   Not on file  Tobacco Use   Smoking status: Never   Smokeless tobacco: Never  Substance and Sexual Activity   Alcohol use: Never   Drug use: Never   Sexual activity: Not Currently  Other Topics Concern   Not on file  Social History Narrative   ** Merged History Encounter **       Social Determinants of Health   Financial Resource Strain: Not on file  Food Insecurity: Not on file  Transportation Needs: Not on file  Physical Activity: Not on file  Stress: Not on file  Social Connections: Not on file     Family History: The patient's family history includes Heart disease in her mother and sister; High blood pressure in her sister.  ROS:   Please see the history of present illness.     All other systems reviewed and are negative.  EKGs/Labs/Other Studies Reviewed:    The following studies were reviewed today:  ***  EKG:    : Sinus ***. Rate *** bpm.  Recent Labs: 05/05/2022: Magnesium 2.1; TSH 4.079 05/06/2022: ALT 12; BUN 8; Creatinine, Ser 0.65; Hemoglobin 11.9; Platelets 264; Potassium 3.7; Sodium 139   Recent Lipid Panel    Component Value Date/Time   CHOL 163 05/05/2022 2015   TRIG 52 05/05/2022 2015   HDL 58 05/05/2022 2015   CHOLHDL 2.8 05/05/2022 2015   VLDL 10 05/05/2022 2015   [place] 95 05/05/2022 2015  Risk Assessment/Calculations:   {Does this patient have ATRIAL FIBRILLATION?[phone]}       Physical Exam:    Wt Readings from Last 3 Encounters:  07/03/22 104 lb 8 oz (47.4 kg)  05/05/22 90 lb 6.2 oz (41 kg)  05/07/20 104 lb (47.2 kg)     VS:  There were no vitals taken for this visit. , BMI There is no height or weight on file to calculate BMI. GENERAL:  Well appearing HEENT: Pupils equal round and reactive, fundi not visualized, oral mucosa unremarkable NECK:  No jugular venous distention, waveform within normal limits, carotid upstroke brisk and symmetric, no bruits,  no thyromegaly LYMPHATICS:  No cervical adenopathy LUNGS:  Clear to auscultation bilaterally HEART:  RRR.  PMI not displaced or sustained,S1 and S2 within normal limits, no S3, no S4, no clicks, no rubs, *** murmurs ABD:  Flat, positive bowel sounds normal in frequency in pitch, no bruits, no rebound, no guarding, no midline pulsatile mass, no hepatomegaly, no splenomegaly EXT:  2 plus pulses throughout, no edema, no cyanosis no clubbing SKIN:  No rashes no nodules NEURO:  Cranial nerves II through XII grossly intact, motor grossly intact throughout PSYCH:  Cognitively intact, oriented to person place and time   ASSESSMENT:    No diagnosis found. PLAN:    No problem-specific Assessment & Plan notes found for this encounter.    {Are you ordering a CV Procedure (e.g. stress test, cath, DCCV, TEE, etc)?   Press F2        :[id number]}   Disposition: FU with Tiffany C. Oval Linsey, MD, [facility] in ***  Medication Adjustments/Labs and Tests Ordered: Current medicines are reviewed at length with the patient today.  Concerns regarding medicines are outlined above.   No orders of the defined types were placed in this encounter.  No orders of the defined types were placed in this encounter.  There are no Patient Instructions on file for this visit.   I,Mathew Stumpf,acting as a Education administrator for Skeet Latch, MD.,have documented all relevant documentation on the behalf of Skeet Latch, MD,as directed by  Skeet Latch, MD while in the presence of Skeet Latch, MD.  ***  Signed, Madelin Rear  07/04/2022 7:28 AM    [place]

## 2022-07-05 LAB — DEMENTIA PANEL
Homocysteine: 12.6 umol/L (ref 0.0–21.3)
RPR Ser Ql: NONREACTIVE
TSH: 4.14 u[IU]/mL (ref 0.450–4.500)
Vitamin B-12: 544 pg/mL (ref 232–1245)

## 2022-07-13 NOTE — Progress Notes (Signed)
Kindly inform the patient that lab work for reversible causes of memory loss was all quite satisfactory.

## 2022-07-14 ENCOUNTER — Telehealth: Payer: Self-pay

## 2022-07-14 NOTE — Telephone Encounter (Signed)
Called patient's son and relayed lab results. Son verbalized understanding. Son had no questions at this time but was encouraged to call back if questions arise.

## 2022-07-14 NOTE — Telephone Encounter (Signed)
-----  Message from Garvin Fila, MD sent at 07/13/2022  6:18 PM EST ----- Kindly inform the patient that lab work for reversible causes of memory loss was all quite satisfactory.

## 2022-09-06 IMAGING — MR MR KNEE*R* WO/W CM
6 of 9 series · 27 of 40 positions shown · IV contrast (multihance)
Comparison: CT 03/03/2020

CLINICAL DATA: Painful right leg mass present for at least 70 years

EXAM:
MRI OF THE RIGHT KNEE WITHOUT AND WITH CONTRAST
TECHNIQUE: Multiplanar, multisequence MR imaging of the right knee was
performed both before and after administration of intravenous
contrast.
CONTRAST:  9mL MULTIHANCE GADOBENATE DIMEGLUMINE 529 MG/ML IV SOLN

[Series 5: T1 · coronal · 4.0mm · 0.62mm/px · 3 of 28 slices shown (1 of 2)]
[im 1/28]
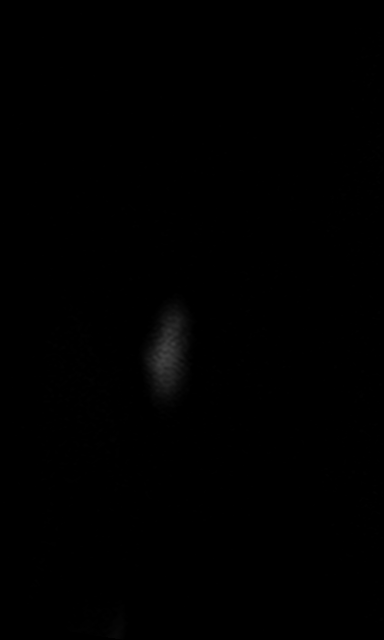
[im 14/28]
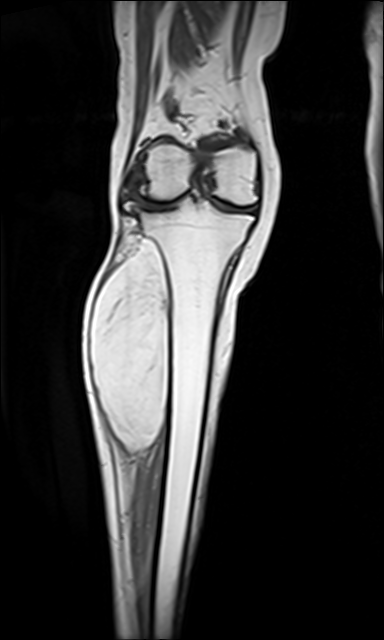
[im 28/28]
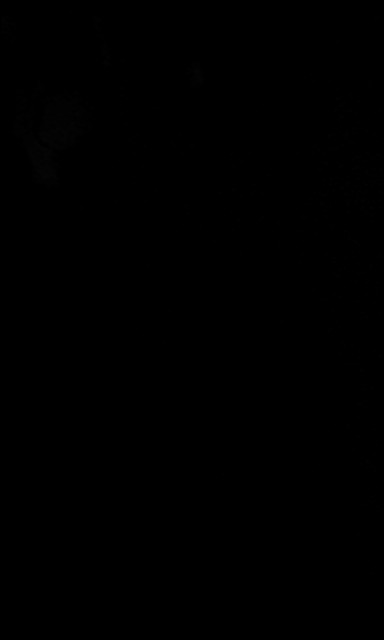

[Series 6: T2 fat-sat · coronal · 4.0mm · 1.25mm/px · 3 of 28 slices shown (1 of 2)]
[im 1/28]
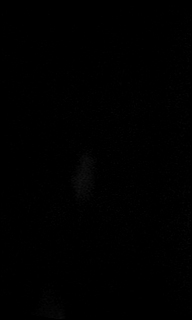
[im 14/28]
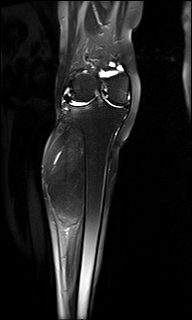
[im 28/28]
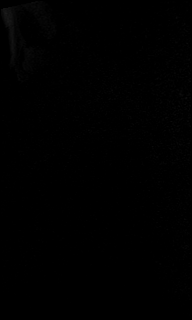

[Series 7: STIR · sagittal · 4.0mm · 1.19mm/px · 3 of 24 slices shown]
[im 1/24]
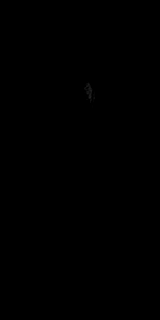
[im 12/24]
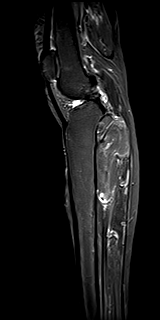
[im 24/24]
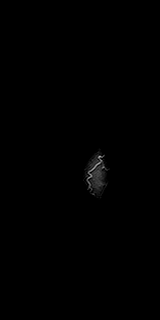

[Series 8: T2 fat-sat · axial · 4.5mm · 0.31mm/px · z∈[-121,+92]mm · 6 of 44 slices shown (2 of 2)]
[im 1/44]
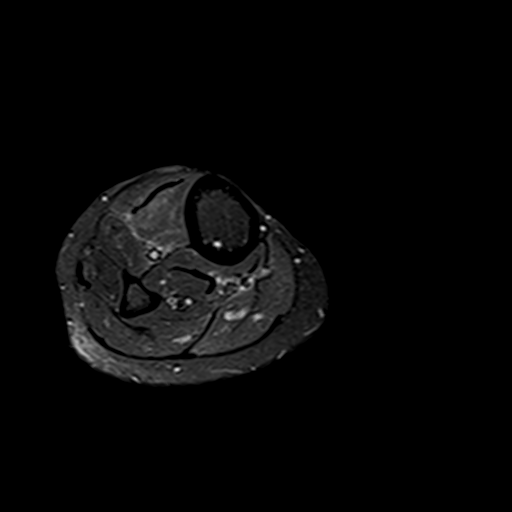
[im 9/44]
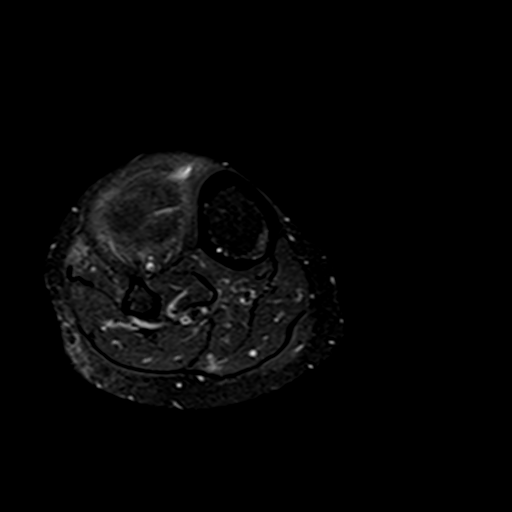
[im 18/44]
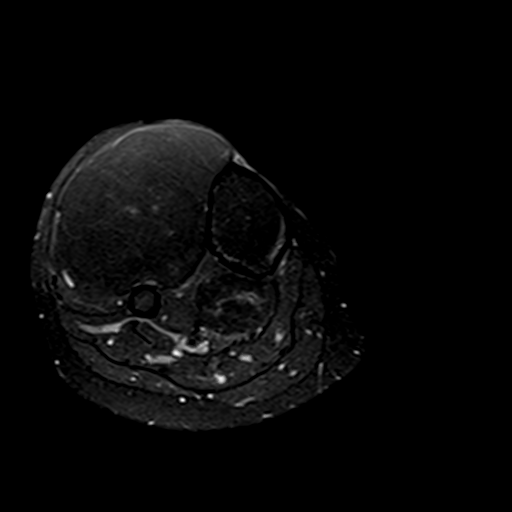
[im 26/44]
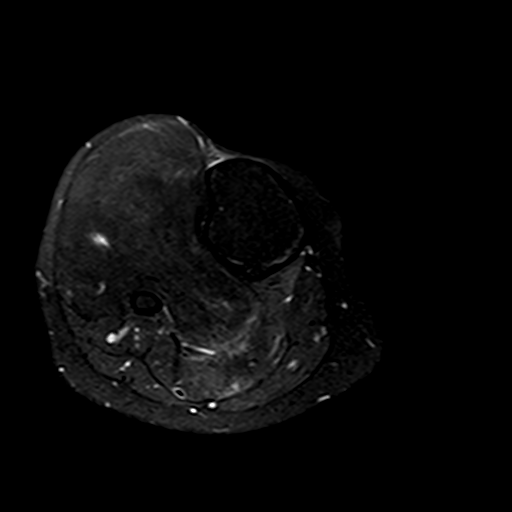
[im 35/44]
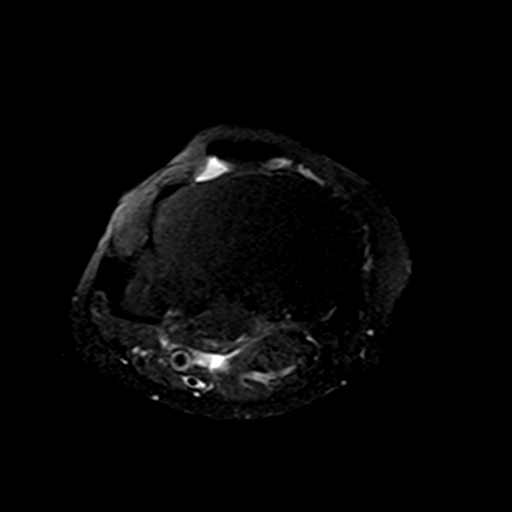
[im 44/44]
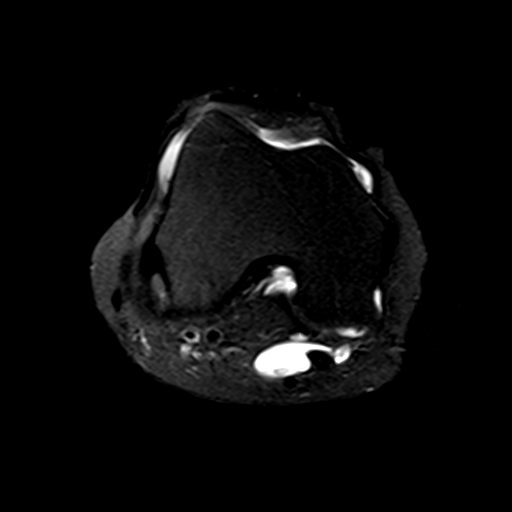

[Series 9: T1 · axial · 4.5mm · 0.31mm/px · z∈[-121,+92]mm · 6 of 44 slices shown (2 of 2)]
[im 1/44]
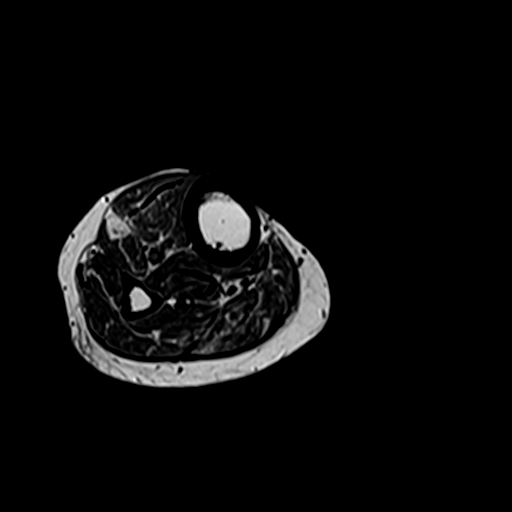
[im 9/44]
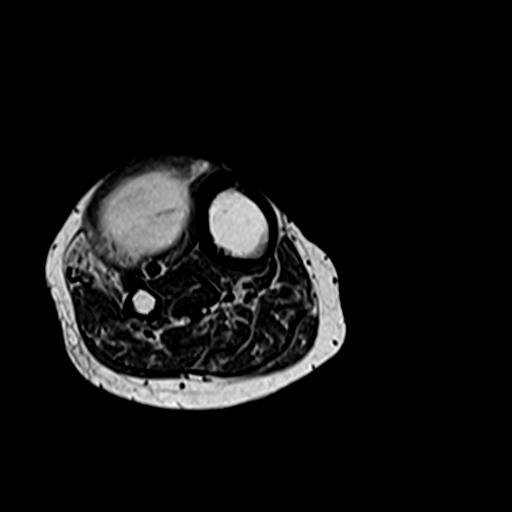
[im 18/44]
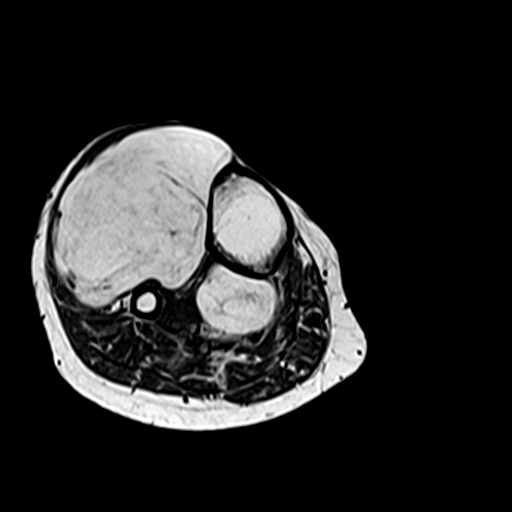
[im 26/44]
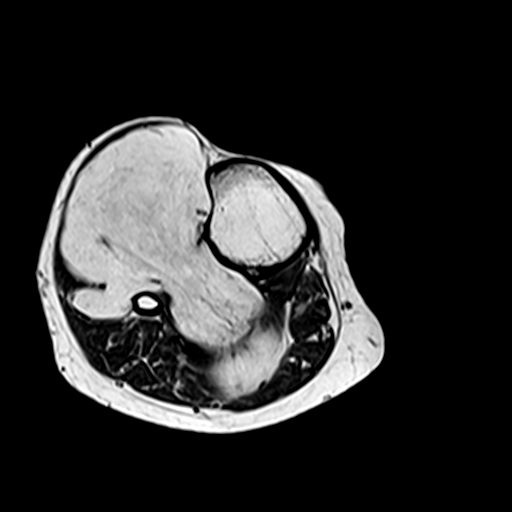
[im 35/44]
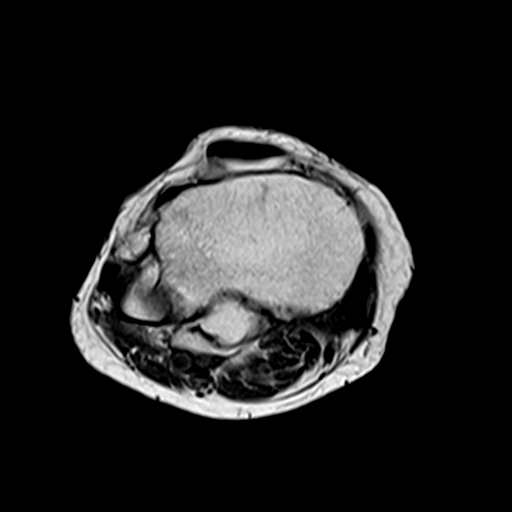
[im 44/44]
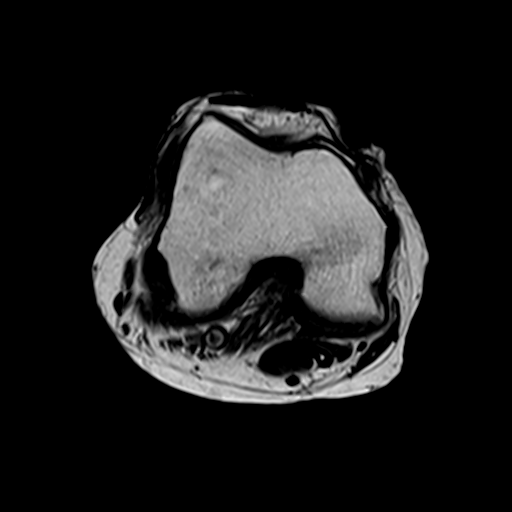

[Series 10: (id) fs · axial · 4.5mm · 0.62mm/px · z∈[-126,+97]mm · 6 of 46 slices shown]
[im 1/46]
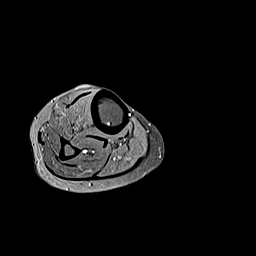
[im 10/46]
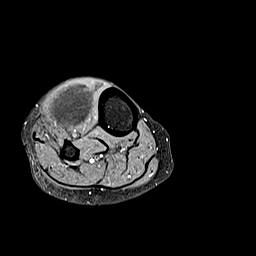
[im 19/46]
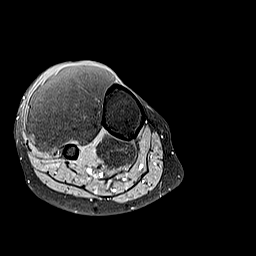
[im 28/46]
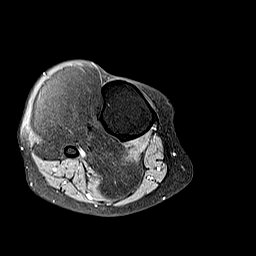
[im 37/46]
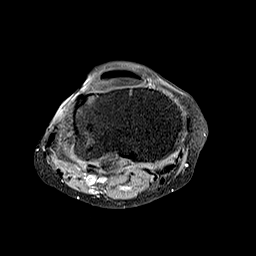
[im 46/46]
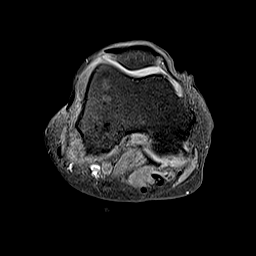

[27 of 40 positions shown; findings below may reference images not displayed]

FINDINGS: Bones/Joint/Cartilage

No acute fracture. No malalignment. There is some smooth remodeling
of the medial cortex of the proximal right tibial metadiaphysis
(series 9, image 20). No suspicious bone lesion. There is no bone
marrow edema or cortical breakthrough. Tricompartmental
osteoarthritis of the right knee, most severely involving the
lateral compartment. Small Baker's cyst containing a few small loose
bodies.

Ligaments

Intact.

Muscles and Tendons

Musculotendinous structures within normal limits.

Soft tissues

Multilobulated soft tissue mass centered within the anterior
compartment of the proximal right lower leg which extends through
the intramuscular septum into the posterior compartment. The more
anterolateral component of the mass measures approximately 14.0 x
5.1 x 6.7 cm (series 5, image 15; series 9, image 26). Posterior
component measures approximately 10.4 x 3.9 x 4.6 cm (series 5,
image 10; series 9, image 15). Mass is predominantly T1 hyperintense
with some internal heterogeneity including thin wispy internal
septae. There are also a few scattered areas slightly more focal low
T1/high T2 signal including within the midportion of the anterior
component of the mass (series 8, image 20). The inferior-most aspect
of the mass within the posterior compartment demonstrates
13-14). The popliteal and posterior tibial neurovascular structures
are posterolaterally displaced. No evidence of neurovascular
invasion. Mass remains confined to the muscle compartments without
extension beyond the superficial investing fascia.
IMPRESSION: 1. Large multilobulated predominantly fat density mass measuring up
to 14 cm located within the anterior compartment of the proximal
right lower leg and extending through the intramuscular septum into
the posterior compartment. There is more internal complexity within
this mass than expected for simple lipoma including a small focal
area of enhancement within the inferior aspect of the mass within
its posterior component. Findings are suggestive of an atypical
lipomatous tumor. Orthopedic oncologic evaluation is recommended, if
not already obtained.
2. There is smooth remodeling of the lateral cortex of the proximal
tibial metaphysis, likely pressure related. No suspicious osseous
lesion.
3. Tricompartmental osteoarthritis of the right knee, most severely
involving the lateral compartment.
4. Small Baker's cyst containing a few small loose bodies.

## 2023-01-15 ENCOUNTER — Other Ambulatory Visit: Payer: Self-pay | Admitting: *Deleted

## 2023-01-26 ENCOUNTER — Ambulatory Visit: Payer: Self-pay | Admitting: Neurology

## 2024-03-20 IMAGING — DX DG CHEST 1V PORT
1 series · 1 of 1 positions shown · non-contrast
Comparison: None Available.

CLINICAL DATA: Right chest pain

EXAM:
PORTABLE CHEST 1 VIEW

[chest ap]
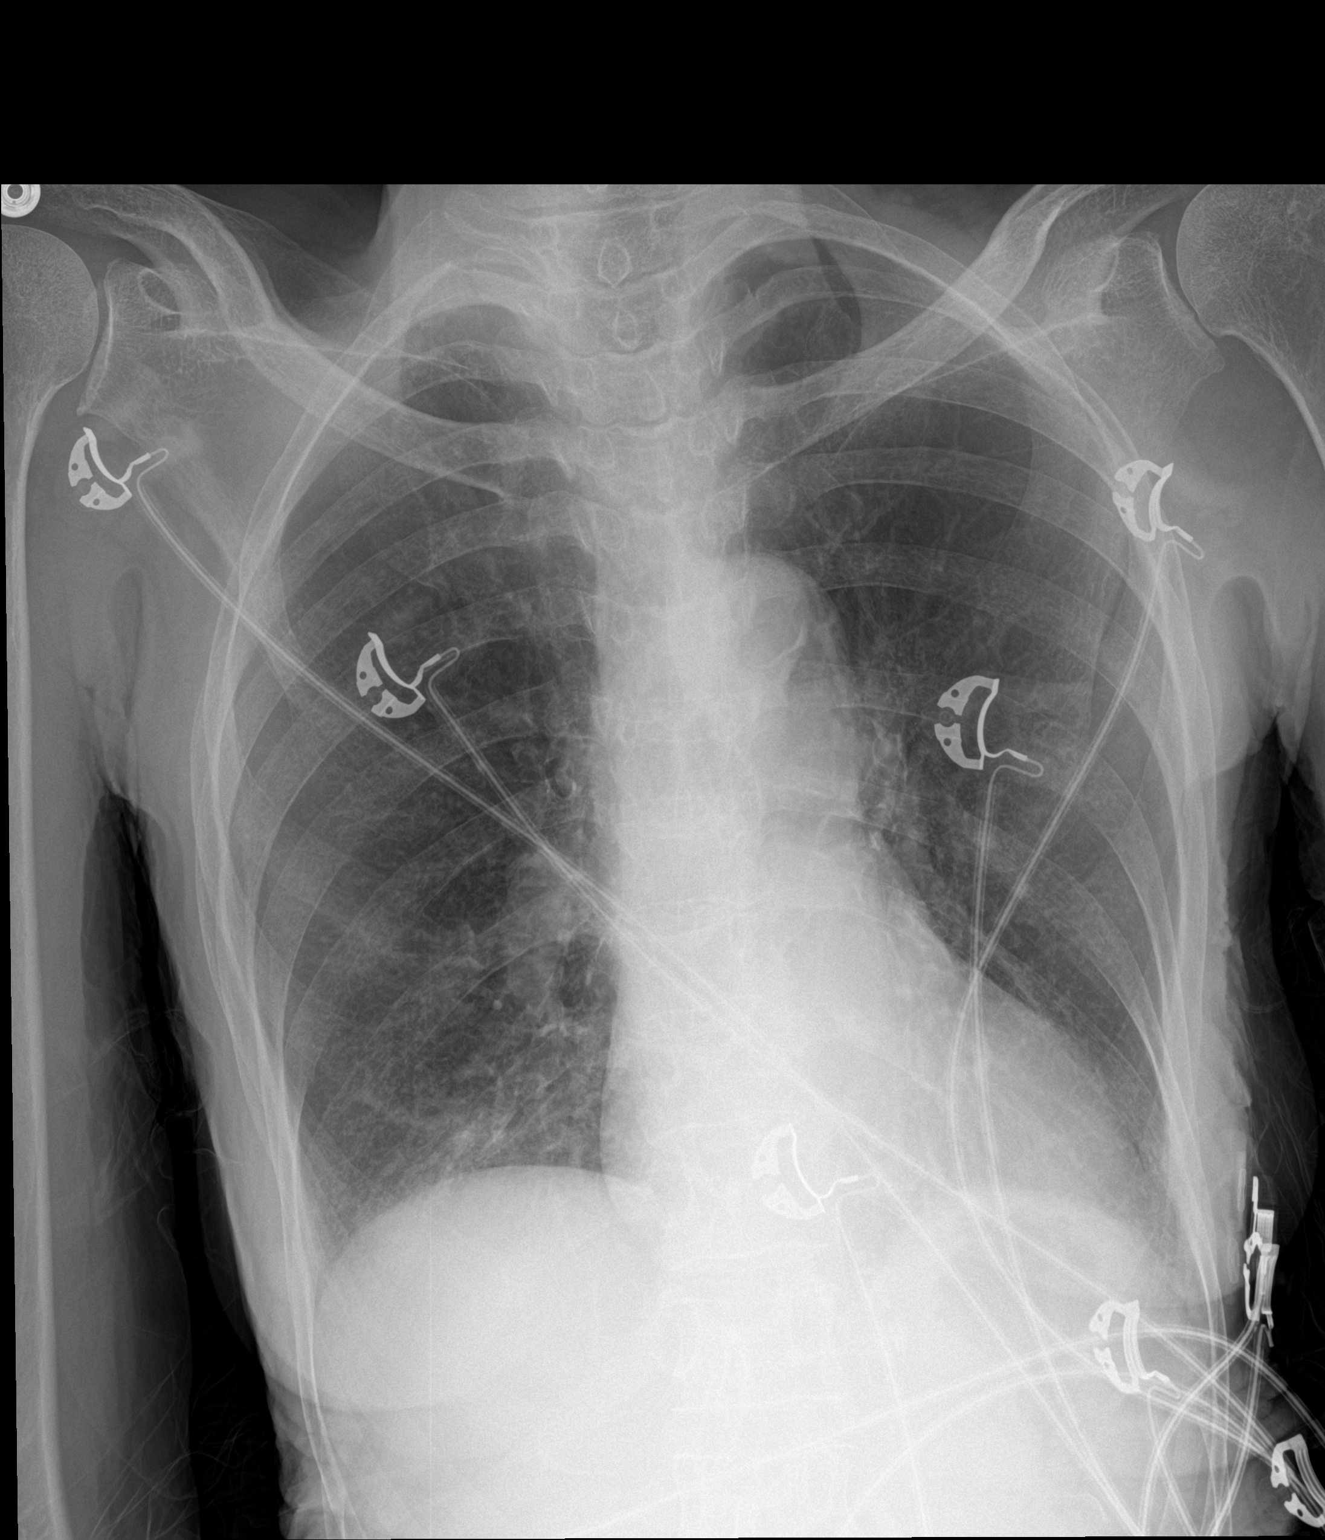

[1 of 1 positions shown; findings below may reference images not displayed]

FINDINGS: The heart size and mediastinal contours are within normal limits.
Atherosclerotic calcification of the aortic arch. Both lungs are
clear. Bilateral glenohumeral osteoarthritis. No acute osseous
abnormality.
IMPRESSION: No active disease.
